# Patient Record
Sex: Female | Born: 1985 | Race: Black or African American | Hispanic: No | Marital: Married | Smoking: Never smoker
Health system: Southern US, Community
[De-identification: ages and names within clinical notes are randomized; demographics above are authoritative.]

## PROBLEM LIST (undated history)

## (undated) ENCOUNTER — Inpatient Hospital Stay (HOSPITAL_COMMUNITY): Payer: Self-pay

## (undated) DIAGNOSIS — IMO0002 Reserved for concepts with insufficient information to code with codable children: Secondary | ICD-10-CM

## (undated) DIAGNOSIS — D649 Anemia, unspecified: Secondary | ICD-10-CM

## (undated) DIAGNOSIS — R87629 Unspecified abnormal cytological findings in specimens from vagina: Secondary | ICD-10-CM

## (undated) DIAGNOSIS — O2302 Infections of kidney in pregnancy, second trimester: Secondary | ICD-10-CM

## (undated) HISTORY — PX: OTHER SURGICAL HISTORY: SHX169

## (undated) HISTORY — PX: COLPOSCOPY: SHX161

---
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
## 2014-10-04 NOTE — L&D Delivery Note (Signed)
Patient is 29 y.o. G1P1001 [redacted]w[redacted]d admitted in [facility]. She had a prolonged/protracted 1st stage of labor with only1 cm of change in 12-13 hours between 9/12 and 9/13. She was put on pitocin 2 times over that period and fetal heart tracing was not-reassuring (Category II tracing) with later decelerations. On 9/13 she was restarted on pitocin and had a Category I strip throughout the day and pitocin was steadily increased. She developed an adequate contraction pattern and progressed to complete at 1450.  The patient pushed from 14:50 until delivery with good maternal effort and fetal descent. With prolonged crowning the infant had deep recurrent variables, nadir in the 70s with good return to baseline between and variability throughout decelerations.Infant's head delivered and had a body cord present which it was delivered through.  Shoulder delivered without problem and infant was initially placed on maternal abdomen and had an initial cry but had non reassuring tone. Cord was cut and infant was transferred to warmer and NICU team arrived.  Infant was noted to have significant moulding and evidence of acynclitic lie as well likely occiput posterior. Dr. Debroah Loop was present in the room for last hour of pushing and at delivery.   Of note mother had bordereline temperature of 37.8 at 1100 but this resolved without intervention as the patient declined tylenol. During pushing there ws fetal tachycardia no maternal tachycardia.   Delivery Note At 5:26 PM a viable female was delivered via Vaginal, Spontaneous Delivery (Presentation: Occiput Anterior).  APGAR: 3, 5; weight 6 lb 7.4 oz (2930 g).   Placenta status: Intact, Spontaneous.  Cord: 3 vessels with the following complications: None.  Cord pH: 7.22  Anesthesia: Epidural  Episiotomy: None Lacerations: 2nd degree;Perineal; Right sulcal Suture Repair: 3.0 Est. Blood Loss (mL): 232  Mom to postpartum.  Baby to Nursery.  Karen Burke [facility] 06/17/2015, 6:35  PM

## 2014-11-13 ENCOUNTER — Ambulatory Visit (INDEPENDENT_AMBULATORY_CARE_PROVIDER_SITE_OTHER): Payer: Self-pay | Admitting: *Deleted

## 2014-11-13 ENCOUNTER — Encounter: Payer: Self-pay | Admitting: *Deleted

## 2014-11-13 DIAGNOSIS — N926 Irregular menstruation, unspecified: Secondary | ICD-10-CM

## 2014-11-13 DIAGNOSIS — R112 Nausea with vomiting, unspecified: Secondary | ICD-10-CM

## 2014-11-13 DIAGNOSIS — Z3201 Encounter for pregnancy test, result positive: Secondary | ICD-10-CM

## 2014-11-13 DIAGNOSIS — O218 Other vomiting complicating pregnancy: Secondary | ICD-10-CM

## 2014-11-13 LAB — POCT PREGNANCY, URINE: Preg Test, Ur: POSITIVE — AB

## 2014-11-13 MED ORDER — PROMETHAZINE HCL 25 MG PO TABS: 25.0000 mg | ORAL_TABLET | Freq: Four times a day (QID) | ORAL | Status: DC | PRN

## 2014-11-13 NOTE — Addendum Note (Signed)
Addended by: Candelaria StagersHAIZLIP, Annaleah Arata E on: 11/13/2014 02:33 PM   Modules accepted: Orders, Level of Service

## 2014-11-13 NOTE — Progress Notes (Addendum)
Pregnancy Test: Positive  Letter of Pregnancy verification given.  LMP 09/05/14 EDC 06/12/15. Reviewed signs/symptoms to come to [facility].  No problems with pregnancy, no ultrasound ordered.  Interpreter present during encounter.  Pt cannot stay today for labs, will draw labs on 1st OB visit.  "FOB request phenergan for nausea, Discussed with Dr. Jolayne Pantheronstant, phenergan prescribed.

## 2014-12-12 ENCOUNTER — Encounter: Payer: Self-pay | Admitting: Family Medicine

## 2014-12-12 ENCOUNTER — Ambulatory Visit (INDEPENDENT_AMBULATORY_CARE_PROVIDER_SITE_OTHER): Payer: Self-pay | Admitting: Family Medicine

## 2014-12-12 VITALS — BP 114/75 | HR 95 | Wt 143.6 lb

## 2014-12-12 DIAGNOSIS — Z118 Encounter for screening for other infectious and parasitic diseases: Secondary | ICD-10-CM

## 2014-12-12 DIAGNOSIS — Z113 Encounter for screening for infections with a predominantly sexual mode of transmission: Secondary | ICD-10-CM

## 2014-12-12 DIAGNOSIS — Z124 Encounter for screening for malignant neoplasm of cervix: Secondary | ICD-10-CM

## 2014-12-12 DIAGNOSIS — Z3492 Encounter for supervision of normal pregnancy, unspecified, second trimester: Secondary | ICD-10-CM | POA: Insufficient documentation

## 2014-12-12 LAB — POCT URINALYSIS DIP (DEVICE)
BILIRUBIN URINE: NEGATIVE
Glucose, UA: NEGATIVE mg/dL
Hgb urine dipstick: NEGATIVE
KETONES UR: NEGATIVE mg/dL
Leukocytes, UA: NEGATIVE
Nitrite: NEGATIVE
PROTEIN: NEGATIVE mg/dL
SPECIFIC GRAVITY, URINE: 1.01 (ref 1.005–1.030)
UROBILINOGEN UA: 0.2 mg/dL (ref 0.0–1.0)
pH: 5.5 (ref 5.0–8.0)

## 2014-12-12 MED ORDER — DOXYLAMINE-PYRIDOXINE 10-10 MG PO TBEC: 1.0000 | DELAYED_RELEASE_TABLET | Freq: Two times a day (BID) | ORAL | Status: DC

## 2014-12-12 MED ORDER — PRENATAL VITAMINS 0.8 MG PO TABS: 1.0000 | ORAL_TABLET | Freq: Every day | ORAL | Status: DC

## 2014-12-12 MED ORDER — RANITIDINE HCL 150 MG PO TABS: 150.0000 mg | ORAL_TABLET | Freq: Two times a day (BID) | ORAL | Status: DC

## 2014-12-12 NOTE — Progress Notes (Signed)
Patient speaks [place] Creole. Interpreter number [phone].

## 2014-12-12 NOTE — Progress Notes (Signed)
   Subjective:    Karen Burke is a G1P0 5629w0d being seen today for her first obstetrical visit.  Her obstetrical history is significant for none. Patient does intend to breast feed. Pregnancy history fully reviewed.  Patient reports heartburn, nausea, no bleeding, no contractions, no cramping and no leaking.  Has some midepigastric pain with nausea  Filed Vitals:   12/12/14 1357  BP: 114/75  Pulse: 95  Weight: 143 lb 9.6 oz (65.137 kg)    HISTORY: OB History  Gravida Para Term Preterm AB SAB TAB Ectopic Multiple Living  1             # Outcome Date GA Lbr Len/2nd Weight Sex Delivery Anes PTL Lv  1 Current              Past Medical History  Diagnosis Date  . Medical history non-contributory    Past Surgical History  Procedure Laterality Date  . Cyst removal arm     Family History  Problem Relation Age of Onset  . Hypertension Mother   . Hypertension Father      Exam    Uterus:     Pelvic Exam:    Perineum: No Hemorrhoids   Vulva: normal   Vagina:  normal mucosa, normal discharge    Declined bimanual exam  System: Breast:  Inspection negative   Skin: normal coloration and turgor, no rashes    Neurologic: oriented, normal   Extremities: normal strength, tone, and muscle mass   HEENT PERRLA and extra ocular movement intact   Mouth/Teeth mucous membranes moist, pharynx normal without lesions   Neck supple and no masses   Cardiovascular: Normal rate    Respiratory:  appears well, vitals normal, no respiratory distress, acyanotic, normal RR, ear and throat exam is normal, neck free of mass or lymphadenopathy, chest clear, no wheezing, crepitations, rhonchi, normal symmetric air entry   Abdomen: soft, non-tender; bowel sounds normal; no masses,  no organomegaly   Urinary: urethral meatus normal      Assessment:    Pregnancy: G1P0 There are no active problems to display for this patient.       Plan:     Initial labs drawn. Prenatal  vitamins. Problem list reviewed and updated. Genetic Screening discussed Quad Screen: to be ordered at next visit.  Ultrasound discussed; fetal survey: requested.  Follow up in 4 weeks. 50% of 20 min visit spent on counseling and coordination of care.  Nausea and midepigastric pain: rx diclegis and zantac, if no improvement may need H pylori testing  Karen Burke ROCIO 12/12/2014

## 2014-12-12 NOTE — Patient Instructions (Signed)
Heartburn During Pregnancy ° Heartburn happens when stomach acid goes up into the esophagus. The esophagus is the tube between the mouth and the stomach. This acid causes a burning pain in the chest or throat. This happens more often in the later part of pregnancy because the womb (uterus) gets larger. It may also happen because of hormone changes. Heartburn problems often go away after giving birth. °HOME CARE °· Take all medicine as told by your doctor. °· Raise the head of your bed with blocks only as told by your doctor. °· Do not exercise right after eating. °· Avoid eating 2-3 hours before bed. Do not lie down right after eating. °· Eat small meals throughout the day instead of 3 large meals. °· Avoid foods that give you heartburn. Foods you may want to avoid include: °¨ Peppers. °¨ Chocolate. °¨ High-fat foods, including fried foods. °¨ Spicy foods. °¨ Garlic and onions. °¨ Citrus fruits, including oranges, grapefruit, lemons, and limes. °¨ Food containing tomatoes or tomato products. °¨ Mint. °¨ Bubbly (carbonated) drinks and drinks with caffeine. °¨ Vinegar. °GET HELP IF: °· You have any belly (abdominal) pain. °· You feel burning in your upper belly or chest, especially after eating or lying down. °· You feel sick to your stomach (nauseous) and throw up (vomit). °· Your stomach feels upset after you eat. °GET HELP RIGHT AWAY IF: °· You have bad chest pain that goes down your arm or into your jaw or neck. °· You feel sweaty, dizzy, or light-headed. °· You have trouble breathing. °· You throw up blood. °· You have trouble or pain when swallowing. °· You have bloody or black poop (stool). °· You have heartburn more than 3 times a week, for more than 2 weeks. °MAKE SURE YOU: °· Understand these instructions. °· Will watch your condition. °· Will get help right away if you are not doing well or get worse. °Document Released: 10/23/2010 Document Revised: 09/25/2013 Document Reviewed: 05/09/2013 °ExitCare®  Patient Information ©2015 [employer]. This information is not intended to replace advice given to you by your health care provider. Make sure you discuss any questions you have with your health care provider. ° °

## 2014-12-13 LAB — PRENATAL PROFILE (SOLSTAS)
Antibody Screen: NEGATIVE
BASOS ABS: 0 10*3/uL (ref 0.0–0.1)
Basophils Relative: 0 % (ref 0–1)
EOS ABS: 0.1 10*3/uL (ref 0.0–0.7)
Eosinophils Relative: 1 % (ref 0–5)
HEMATOCRIT: 32.6 % — AB (ref 36.0–46.0)
HIV 1&2 Ab, 4th Generation: NONREACTIVE
Hemoglobin: 10.4 g/dL — ABNORMAL LOW (ref 12.0–15.0)
Hepatitis B Surface Ag: NEGATIVE
LYMPHS ABS: 1.6 10*3/uL (ref 0.7–4.0)
LYMPHS PCT: 30 % (ref 12–46)
MCH: 26.1 pg (ref 26.0–34.0)
MCHC: 31.9 g/dL (ref 30.0–36.0)
MCV: 81.9 fL (ref 78.0–100.0)
MONO ABS: 0.7 10*3/uL (ref 0.1–1.0)
MONOS PCT: 13 % — AB (ref 3–12)
MPV: 11.4 fL (ref 8.6–12.4)
Neutro Abs: 3 10*3/uL (ref 1.7–7.7)
Neutrophils Relative %: 56 % (ref 43–77)
Platelets: 257 10*3/uL (ref 150–400)
RBC: 3.98 MIL/uL (ref 3.87–5.11)
RDW: 16 % — AB (ref 11.5–15.5)
RH TYPE: POSITIVE
RUBELLA: 9.93 {index} — AB (ref <0.90)
WBC: 5.4 10*3/uL (ref 4.0–10.5)

## 2014-12-13 LAB — CYTOLOGY - PAP

## 2014-12-13 LAB — CULTURE, OB URINE
COLONY COUNT: NO GROWTH
ORGANISM ID, BACTERIA: NO GROWTH

## 2014-12-16 LAB — HEMOGLOBINOPATHY EVALUATION
HGB A: 96.8 % (ref 96.8–97.8)
HGB S QUANTITAION: 0 %
Hemoglobin Other: 0 %
Hgb A2 Quant: 2.9 % (ref 2.2–3.2)
Hgb F Quant: 0.3 % (ref 0.0–2.0)

## 2014-12-17 ENCOUNTER — Encounter: Payer: Self-pay | Admitting: Family Medicine

## 2014-12-17 DIAGNOSIS — Z789 Other specified health status: Secondary | ICD-10-CM | POA: Insufficient documentation

## 2014-12-17 DIAGNOSIS — Z603 Acculturation difficulty: Secondary | ICD-10-CM | POA: Insufficient documentation

## 2014-12-17 LAB — PRESCRIPTION MONITORING PROFILE (19 PANEL)
AMPHETAMINE/METH: NEGATIVE ng/mL
BARBITURATE SCREEN, URINE: NEGATIVE ng/mL
Benzodiazepine Screen, Urine: NEGATIVE ng/mL
Buprenorphine, Urine: NEGATIVE ng/mL
CANNABINOID SCRN UR: NEGATIVE ng/mL
CARISOPRODOL, URINE: NEGATIVE ng/mL
COCAINE METABOLITES: NEGATIVE ng/mL
Creatinine, Urine: 55.63 mg/dL (ref >20.0)
FENTANYL URINE: NEGATIVE ng/mL
MDMA URINE: NEGATIVE ng/mL
METHADONE SCREEN, URINE: NEGATIVE ng/mL
Meperidine, Ur: NEGATIVE ng/mL
Methaqualone: NEGATIVE ng/mL
NITRITES URINE, INITIAL: NEGATIVE ug/mL
Opiate Screen, Urine: NEGATIVE ng/mL
Oxycodone Screen, Ur: NEGATIVE ng/mL
PHENCYCLIDINE, UR: NEGATIVE ng/mL
Propoxyphene: NEGATIVE ng/mL
TAPENTADOLUR: NEGATIVE ng/mL
Tramadol Scrn, Ur: NEGATIVE ng/mL
Zolpidem, Urine: NEGATIVE ng/mL
pH, Initial: 6.8 pH (ref 4.5–8.9)

## 2015-01-09 ENCOUNTER — Ambulatory Visit (INDEPENDENT_AMBULATORY_CARE_PROVIDER_SITE_OTHER): Payer: Self-pay | Admitting: Physician Assistant

## 2015-01-09 ENCOUNTER — Encounter: Payer: Self-pay | Admitting: Physician Assistant

## 2015-01-09 VITALS — BP 132/74 | HR 100 | Temp 97.9°F | Wt 146.8 lb

## 2015-01-09 DIAGNOSIS — B3731 Acute candidiasis of vulva and vagina: Secondary | ICD-10-CM

## 2015-01-09 DIAGNOSIS — Z3492 Encounter for supervision of normal pregnancy, unspecified, second trimester: Secondary | ICD-10-CM

## 2015-01-09 DIAGNOSIS — O98812 Other maternal infectious and parasitic diseases complicating pregnancy, second trimester: Secondary | ICD-10-CM

## 2015-01-09 DIAGNOSIS — Z3482 Encounter for supervision of other normal pregnancy, second trimester: Secondary | ICD-10-CM

## 2015-01-09 DIAGNOSIS — B373 Candidiasis of vulva and vagina: Secondary | ICD-10-CM

## 2015-01-09 LAB — POCT URINALYSIS DIP (DEVICE)
Bilirubin Urine: NEGATIVE
Glucose, UA: NEGATIVE mg/dL
Hgb urine dipstick: NEGATIVE
KETONES UR: NEGATIVE mg/dL
LEUKOCYTES UA: NEGATIVE
NITRITE: NEGATIVE
Protein, ur: NEGATIVE mg/dL
SPECIFIC GRAVITY, URINE: 1.01 (ref 1.005–1.030)
Urobilinogen, UA: 0.2 mg/dL (ref 0.0–1.0)
pH: 6.5 (ref 5.0–8.0)

## 2015-01-09 MED ORDER — TERCONAZOLE 0.4 % VA CREA: 1.0000 | TOPICAL_CREAM | Freq: Every day | VAGINAL | Status: DC

## 2015-01-09 NOTE — Patient Instructions (Signed)
AFP Maternal This is a routine screen (tests) used to check for fetal abnormalities such as Down syndrome and neural tube defects. Down syndrome is a chromosomal abnormality, sometimes called Trisomy 21. Neural tube defects are serious birth defects. The brain, spinal cord, or their coverings do not develop completely. Women should be tested in the 15th to 20th week of pregnancy. The msAFP screen involves three or four tests that measure substances found in the blood that make the testing better. During development, AFP levels in fetal blood and amniotic fluid rise until about 12 weeks. The levels then gradually fall until birth. AFP is a protein produce by fetal tissue. AFP crosses the placenta and appears in the maternal blood. A baby with an open neural tube defect has an opening in its spine, head, or abdominal wall that allows higher than usual amounts of AFP to pass into the mother's blood. If a screen is positive, more tests are needed to make a diagnosis. These include ultrasound and perhaps amniocentesis (checking the fluid that surrounds the baby). These tests are used to help women and their caregivers make decisions about the management of their pregnancies. In pregnancies where the fetus is carrying the chromosomal defect that results in Down syndrome, the levels of AFP and unconjugated estriol tend to be low and hCG and inhibin A levels high.  PREPARATION FOR TEST Blood is drawn from a vein in your arm usually between the 15th and 20th weeks of pregnancy. Four different tests on your blood are done. These are AFP, hCG, unconjugated estriol, and inhibin A. The combination of tests produces a more accurate result. NORMAL FINDINGS   Adult: less than 40 ng/mL or less than 40 mg/L (SI units).  Child younger than 1 year: less than 30 ng/mL. Ranges are stratified by weeks of gestation and vary among laboratories. Ranges for normal findings may vary among different laboratories and hospitals. You  should always check with your doctor after having lab work or other tests done to discuss the meaning of your test results and whether your values are considered within normal limits. MEANING OF TEST  These are screening tests. Not all fetal abnormalities will give positive test results. Of all women who have positive AFP screening results, only a very small number of them have babies who actually have a neural tube defect or chromosomal abnormality. Your caregiver will go over the test results with you and discuss the importance and meaning of your results, as well as treatment options and the need for additional tests if necessary. OBTAINING THE TEST RESULTS It is your responsibility to obtain your test results. Ask the lab or department performing the test when and how you will get your results. Document Released: 10/12/2004 Document Revised: 02/04/2014 Document Reviewed: 08/24/2008 ExitCare Patient Information 2015 [employer]. This information is not intended to replace advice given to you by your health care provider. Make sure you discuss any questions you have with your health care provider.  

## 2015-01-09 NOTE — Progress Notes (Signed)
Karen Burke Interpreter for encounter Pt complains of itchy discharge

## 2015-01-09 NOTE — Progress Notes (Signed)
18 weeks,  stable.  concerned about itching at external genitalia.  Rx for terazole Wet prep pending Keep u/s appt 4.15 RTC for OBF 4 weeks

## 2015-01-10 LAB — WET PREP, GENITAL
CLUE CELLS WET PREP: NONE SEEN
Trich, Wet Prep: NONE SEEN
WBC, Wet Prep HPF POC: NONE SEEN
Yeast Wet Prep HPF POC: NONE SEEN

## 2015-01-16 ENCOUNTER — Ambulatory Visit (HOSPITAL_COMMUNITY): Payer: Self-pay

## 2015-01-17 ENCOUNTER — Ambulatory Visit (HOSPITAL_COMMUNITY)
Admission: RE | Admit: 2015-01-17 | Discharge: 2015-01-17 | Disposition: A | Discharge status: Home / Self Care | Payer: Self-pay | Source: Ambulatory Visit | Attending: Family Medicine | Admitting: Family Medicine

## 2015-01-17 DIAGNOSIS — Z3A19 19 weeks gestation of pregnancy: Secondary | ICD-10-CM | POA: Insufficient documentation

## 2015-01-17 DIAGNOSIS — Z3A20 20 weeks gestation of pregnancy: Secondary | ICD-10-CM | POA: Insufficient documentation

## 2015-01-17 DIAGNOSIS — Z3689 Encounter for other specified antenatal screening: Secondary | ICD-10-CM | POA: Insufficient documentation

## 2015-01-17 DIAGNOSIS — Z3492 Encounter for supervision of normal pregnancy, unspecified, second trimester: Secondary | ICD-10-CM

## 2015-01-17 DIAGNOSIS — Z36 Encounter for antenatal screening of mother: Secondary | ICD-10-CM | POA: Insufficient documentation

## 2015-02-11 ENCOUNTER — Encounter: Payer: Self-pay | Admitting: Family

## 2015-02-11 ENCOUNTER — Ambulatory Visit (INDEPENDENT_AMBULATORY_CARE_PROVIDER_SITE_OTHER): Payer: Self-pay | Admitting: Family

## 2015-02-11 VITALS — BP 115/69 | HR 109 | Wt 153.2 lb

## 2015-02-11 DIAGNOSIS — Z3492 Encounter for supervision of normal pregnancy, unspecified, second trimester: Secondary | ICD-10-CM

## 2015-02-11 DIAGNOSIS — Z3482 Encounter for supervision of other normal pregnancy, second trimester: Secondary | ICD-10-CM

## 2015-02-11 LAB — POCT URINALYSIS DIP (DEVICE)
Bilirubin Urine: NEGATIVE
Glucose, UA: NEGATIVE mg/dL
Hgb urine dipstick: NEGATIVE
KETONES UR: NEGATIVE mg/dL
LEUKOCYTES UA: NEGATIVE
Nitrite: NEGATIVE
PH: 7 (ref 5.0–8.0)
PROTEIN: NEGATIVE mg/dL
Specific Gravity, Urine: 1.02 (ref 1.005–1.030)
Urobilinogen, UA: 0.2 mg/dL (ref 0.0–1.0)

## 2015-02-11 NOTE — Progress Notes (Signed)
Interpreter:  Areatha KeasMarie Ferron   Reviewed ultrasound results (bilat renal pyelectasis and poor view of heart); rescan in one week.  Pt husband brought in bills with concern for needing to pay and never receiving the result.  Explained that results are typically reviewed if abnormal.  Reviewed all results with patient and husband.  Husband states a genetic test was drawn too early and they declined testing.  Requested he bring in lab bill and will talk with Tresa EndoKelly R. About getting it removed/waived.

## 2015-02-19 ENCOUNTER — Ambulatory Visit (HOSPITAL_COMMUNITY): Payer: Self-pay

## 2015-03-11 ENCOUNTER — Encounter: Payer: Self-pay | Admitting: Family

## 2015-04-11 ENCOUNTER — Ambulatory Visit (HOSPITAL_COMMUNITY): Admission: RE | Admit: 2015-04-11 | Payer: Self-pay | Source: Ambulatory Visit

## 2015-04-11 ENCOUNTER — Ambulatory Visit (HOSPITAL_COMMUNITY)
Admission: RE | Admit: 2015-04-11 | Discharge: 2015-04-11 | Disposition: A | Discharge status: Home / Self Care | Payer: BLUE CROSS/BLUE SHIELD | Source: Ambulatory Visit | Attending: Family | Admitting: Family

## 2015-04-11 DIAGNOSIS — O35EXX0 Maternal care for other (suspected) fetal abnormality and damage, fetal genitourinary anomalies, not applicable or unspecified: Secondary | ICD-10-CM | POA: Insufficient documentation

## 2015-04-11 DIAGNOSIS — O358XX0 Maternal care for other (suspected) fetal abnormality and damage, not applicable or unspecified: Secondary | ICD-10-CM | POA: Insufficient documentation

## 2015-04-11 DIAGNOSIS — Z36 Encounter for antenatal screening of mother: Secondary | ICD-10-CM | POA: Diagnosis not present

## 2015-04-11 DIAGNOSIS — Z3A31 31 weeks gestation of pregnancy: Secondary | ICD-10-CM | POA: Insufficient documentation

## 2015-04-11 DIAGNOSIS — O283 Abnormal ultrasonic finding on antenatal screening of mother: Secondary | ICD-10-CM | POA: Diagnosis not present

## 2015-04-11 DIAGNOSIS — IMO0002 Reserved for concepts with insufficient information to code with codable children: Secondary | ICD-10-CM | POA: Insufficient documentation

## 2015-04-11 DIAGNOSIS — Z0489 Encounter for examination and observation for other specified reasons: Secondary | ICD-10-CM | POA: Insufficient documentation

## 2015-04-14 DIAGNOSIS — IMO0002 Reserved for concepts with insufficient information to code with codable children: Secondary | ICD-10-CM | POA: Insufficient documentation

## 2015-04-16 ENCOUNTER — Ambulatory Visit (INDEPENDENT_AMBULATORY_CARE_PROVIDER_SITE_OTHER): Payer: BLUE CROSS/BLUE SHIELD | Admitting: Advanced Practice Midwife

## 2015-04-16 VITALS — BP 108/73 | HR 102 | Temp 98.3°F | Wt 160.0 lb

## 2015-04-16 DIAGNOSIS — Z3482 Encounter for supervision of other normal pregnancy, second trimester: Secondary | ICD-10-CM

## 2015-04-16 DIAGNOSIS — Z3492 Encounter for supervision of normal pregnancy, unspecified, second trimester: Secondary | ICD-10-CM

## 2015-04-16 LAB — POCT URINALYSIS DIP (DEVICE)
Bilirubin Urine: NEGATIVE
Glucose, UA: NEGATIVE mg/dL
HGB URINE DIPSTICK: NEGATIVE
Ketones, ur: NEGATIVE mg/dL
Nitrite: NEGATIVE
Protein, ur: NEGATIVE mg/dL
Specific Gravity, Urine: 1.015 (ref 1.005–1.030)
Urobilinogen, UA: 0.2 mg/dL (ref 0.0–1.0)
pH: 7 (ref 5.0–8.0)

## 2015-04-16 NOTE — Progress Notes (Signed)
[employer] interpreter [phone] used for visit today Reviewed tip of week with patient

## 2015-04-23 NOTE — Progress Notes (Signed)
No complaints. [employer] interpreter used for visit, but pt prefers to use her husband for interpreting. He is a paramedic and understands medical terminology. . Discussed fetal pyelectasis. F/U w/ peds after birth.

## 2015-04-24 ENCOUNTER — Encounter: Payer: Self-pay | Admitting: *Deleted

## 2015-04-30 ENCOUNTER — Ambulatory Visit (INDEPENDENT_AMBULATORY_CARE_PROVIDER_SITE_OTHER): Payer: BLUE CROSS/BLUE SHIELD | Admitting: Advanced Practice Midwife

## 2015-04-30 VITALS — BP 112/74 | HR 97 | Temp 98.7°F | Wt 163.1 lb

## 2015-04-30 DIAGNOSIS — Z23 Encounter for immunization: Secondary | ICD-10-CM | POA: Diagnosis not present

## 2015-04-30 DIAGNOSIS — Z3483 Encounter for supervision of other normal pregnancy, third trimester: Secondary | ICD-10-CM | POA: Diagnosis not present

## 2015-04-30 LAB — POCT URINALYSIS DIP (DEVICE)
Bilirubin Urine: NEGATIVE
Glucose, UA: NEGATIVE mg/dL
HGB URINE DIPSTICK: NEGATIVE
Ketones, ur: NEGATIVE mg/dL
Leukocytes, UA: NEGATIVE
Nitrite: NEGATIVE
Protein, ur: NEGATIVE mg/dL
SPECIFIC GRAVITY, URINE: 1.015 (ref 1.005–1.030)
Urobilinogen, UA: 0.2 mg/dL (ref 0.0–1.0)
pH: 6.5 (ref 5.0–8.0)

## 2015-04-30 LAB — CBC
HCT: 28.8 % — ABNORMAL LOW (ref 36.0–46.0)
Hemoglobin: 9.3 g/dL — ABNORMAL LOW (ref 12.0–15.0)
MCH: 26.8 pg (ref 26.0–34.0)
MCHC: 32.3 g/dL (ref 30.0–36.0)
MCV: 83 fL (ref 78.0–100.0)
MPV: 11.3 fL (ref 8.6–12.4)
Platelets: 244 10*3/uL (ref 150–400)
RBC: 3.47 MIL/uL — ABNORMAL LOW (ref 3.87–5.11)
RDW: 15.8 % — AB (ref 11.5–15.5)
WBC: 6.3 10*3/uL (ref 4.0–10.5)

## 2015-04-30 MED ORDER — TETANUS-DIPHTH-ACELL PERTUSSIS 5-2.5-18.5 LF-MCG/0.5 IM SUSP
0.5000 mL | Freq: Once | INTRAMUSCULAR | Status: AC
  Administered 2015-04-30: 0.5 mL via INTRAMUSCULAR

## 2015-04-30 NOTE — Progress Notes (Signed)
Used Interpreter  Karen Burke. Declines rpr and  hiv test.

## 2015-04-30 NOTE — Progress Notes (Signed)
Used interpreter Areatha Keas for all communication.    Subjective:  Karen Burke is a 29 y.o. G1P0 at [redacted]w[redacted]d being seen today for ongoing prenatal care.  Patient reports no complaints and some occasional sharp abdominal pains with movement.  Contractions: Irregular.  Vag. Bleeding: None. Movement: Present. Denies leaking of fluid.   The following portions of the patient's history were reviewed and updated as appropriate: allergies, current medications, past family history, past medical history, past social history, past surgical history and problem list.   Objective:   Filed Vitals:   04/30/15 1038  BP: 112/74  Pulse: 97  Temp: 98.7 F (37.1 C)  Weight: 163 lb 1.6 oz (73.982 kg)    Fetal Status: Fetal Heart Rate (bpm): 148 Fundal Height: 34 cm Movement: Present     General:  Alert, oriented and cooperative. Patient is in no acute distress.  Skin: Skin is warm and dry. No rash noted.   Cardiovascular: Normal heart rate noted  Respiratory: Normal respiratory effort, no problems with respiration noted  Abdomen: Soft, gravid, appropriate for gestational age. Pain/Pressure: Present     Vaginal: Vag. Bleeding: None.       Cervix: Not evaluated        Extremities: Normal range of motion.  Edema: Mild pitting, slight indentation  Mental Status: Normal mood and affect. Normal behavior. Normal judgment and thought content.   Urinalysis: Urine Protein: Negative Urine Glucose: Negative  Assessment and Plan:  Pregnancy: G1P0 at [redacted]w[redacted]d  1. Supervision of normal pregnancy, antepartum, third trimester  - Glucose Tolerance, 1 HR (50g) w/o Fasting - CBC - Tdap (BOOSTRIX) injection 0.5 mL; Inject 0.5 mLs into the muscle once.  Preterm labor symptoms and general obstetric precautions including but not limited to vaginal bleeding, contractions, leaking of fluid and fetal movement were reviewed in detail with the patient. Reviewed recent U/S results and pyelectasis with pt. Questions  answered.  Please refer to After Visit Summary for other counseling recommendations.  Return in about 2 weeks (around 05/14/2015).   Hurshel Party, CNM

## 2015-05-01 LAB — GLUCOSE TOLERANCE, 1 HOUR (50G) W/O FASTING: GLUCOSE 1 HOUR GTT: 90 mg/dL (ref 70–140)

## 2015-05-04 ENCOUNTER — Encounter: Payer: Self-pay | Admitting: Advanced Practice Midwife

## 2015-05-04 ENCOUNTER — Other Ambulatory Visit: Payer: Self-pay | Admitting: Advanced Practice Midwife

## 2015-05-04 DIAGNOSIS — O99013 Anemia complicating pregnancy, third trimester: Secondary | ICD-10-CM | POA: Insufficient documentation

## 2015-05-04 MED ORDER — PRENATAL VITAMINS 28-0.8 MG PO TABS: 1.0000 | ORAL_TABLET | Freq: Every day | ORAL | Status: DC

## 2015-05-04 MED ORDER — FERROUS SULFATE 325 (65 FE) MG PO TABS: 325.0000 mg | ORAL_TABLET | Freq: Two times a day (BID) | ORAL | Status: DC

## 2015-05-05 ENCOUNTER — Telehealth: Payer: Self-pay | Admitting: *Deleted

## 2015-05-05 NOTE — Telephone Encounter (Signed)
-----   Message from Hurshel Party, CNM sent at 05/04/2015 12:46 AM EDT ----- Pt recent labs indicate she is anemic. She should be taking PNV daily and ferrous sulfate 1-2 times per day.  I sent Rx to her pharmacy. These may cause constipation.  She should drink plenty of water and eat a high fiber diet.  She may take Colace, stool softener, as needed.  Please call to let her know. Thank you.

## 2015-05-05 NOTE — Telephone Encounter (Signed)
Contacted patient, recommendations to relieve constipation given. Pt verbalizes understanding.  Spouse used as Equities trader.

## 2015-05-20 ENCOUNTER — Ambulatory Visit (INDEPENDENT_AMBULATORY_CARE_PROVIDER_SITE_OTHER): Payer: BLUE CROSS/BLUE SHIELD | Admitting: Obstetrics and Gynecology

## 2015-05-20 ENCOUNTER — Other Ambulatory Visit: Payer: Self-pay | Admitting: Obstetrics and Gynecology

## 2015-05-20 VITALS — BP 120/77 | HR 93 | Temp 97.9°F | Wt 167.8 lb

## 2015-05-20 DIAGNOSIS — O99820 Streptococcus B carrier state complicating pregnancy: Secondary | ICD-10-CM

## 2015-05-20 DIAGNOSIS — O99013 Anemia complicating pregnancy, third trimester: Secondary | ICD-10-CM

## 2015-05-20 LAB — POCT URINALYSIS DIP (DEVICE)
BILIRUBIN URINE: NEGATIVE
GLUCOSE, UA: NEGATIVE mg/dL
Hgb urine dipstick: NEGATIVE
Ketones, ur: NEGATIVE mg/dL
Leukocytes, UA: NEGATIVE
Nitrite: NEGATIVE
Protein, ur: NEGATIVE mg/dL
SPECIFIC GRAVITY, URINE: 1.015 (ref 1.005–1.030)
Urobilinogen, UA: 0.2 mg/dL (ref 0.0–1.0)
pH: 7 (ref 5.0–8.0)

## 2015-05-20 LAB — OB RESULTS CONSOLE GC/CHLAMYDIA
CHLAMYDIA, DNA PROBE: NEGATIVE
GC PROBE AMP, GENITAL: NEGATIVE

## 2015-05-20 LAB — OB RESULTS CONSOLE GBS: STREP GROUP B AG: POSITIVE

## 2015-05-20 NOTE — Patient Instructions (Addendum)
Iron-Rich Diet An iron-rich diet contains foods that are good sources of iron. Iron is an important mineral that helps your body produce hemoglobin. Hemoglobin is a protein in red blood cells that carries oxygen to the body's tissues. Sometimes, the iron level in your blood can be low. This may be caused by:  A lack of iron in your diet.  Blood loss.  Times of growth, such as during pregnancy or during a child's growth and development. Low levels of iron can cause a decrease in the number of red blood cells. This can result in iron deficiency anemia. Iron deficiency anemia symptoms include:  Tiredness.  Weakness.  Irritability.  Increased chance of infection. Here are some recommendations for daily iron intake:  Males older than 27 years of age need 8 mg of iron per day.  Women ages 32 to 63 need 18 mg of iron per day.  Pregnant women need 27 mg of iron per day, and women who are over 24 years of age and breastfeeding need 9 mg of iron per day.  Women over the age of 60 need 8 mg of iron per day. SOURCES OF IRON There are 2 types of iron that are found in food: heme iron and nonheme iron. Heme iron is absorbed by the body better than nonheme iron. Heme iron is found in meat, poultry, and fish. Nonheme iron is found in grains, beans, and vegetables. Heme Iron Sources Food / Iron (mg)  Chicken liver, 3 oz (85 g)/ 10 mg  Beef liver, 3 oz (85 g)/ 5.5 mg  Oysters, 3 oz (85 g)/ 8 mg  Beef, 3 oz (85 g)/ 2 to 3 mg  Shrimp, 3 oz (85 g)/ 2.8 mg  [place], 3 oz (85 g)/ 2 mg  Chicken, 3 oz (85 g) / 1 mg  Fish (tuna, halibut), 3 oz (85 g)/ 1 mg  Pork, 3 oz (85 g)/ 0.9 mg Nonheme Iron Sources Food / Iron (mg)  Ready-to-eat breakfast cereal, iron-fortified / 3.9 to 7 mg  Tofu,  cup / 3.4 mg  Kidney beans,  cup / 2.6 mg  Baked potato with skin / 2.7 mg  Asparagus,  cup / 2.2 mg  Avocado / 2 mg  Dried peaches,  cup / 1.6 mg  Raisins,  cup / 1.5 mg  Soy milk, 1 cup  / 1.5 mg  Whole-wheat bread, 1 slice / 1.2 mg  Spinach, 1 cup / 0.8 mg  Broccoli,  cup / 0.6 mg IRON ABSORPTION Certain foods can decrease the body's absorption of iron. Try to avoid these foods and beverages while eating meals with iron-containing foods:  Coffee.  Tea.  Fiber.  Soy. Foods containing vitamin C can help increase the amount of iron your body absorbs from iron sources, especially from nonheme sources. Eat foods with vitamin C along with iron-containing foods to increase your iron absorption. Foods that are high in vitamin C include many fruits and vegetables. Some good sources are:  Fresh orange juice.  Oranges.  Strawberries.  Mangoes.  Grapefruit.  Red bell peppers.  Green bell peppers.  Broccoli.  Potatoes with skin.  Tomato juice. Document Released: 05/04/2005 Document Revised: 12/13/2011 Document Reviewed: 03/11/2011 [facility] Patient Information 2015 [place], [place]. This information is not intended to replace advice given to you by your health care provider. Make sure you discuss any questions you have with your health care provider. Round Ligament Pain During Pregnancy   Round ligament pain is a sharp pain or jabbing  feeling often felt in the lower belly or groin area on one or both sides. It is one of the most common complaints during pregnancy and is considered a normal part of pregnancy. It is most often felt during the second trimester.   Here is what you need to know about round ligament pain, including some tips to help you feel better.   Causes of Round Ligament Pain   Several thick ligaments surround and support your womb (uterus) as it grows during pregnancy. One of them is called the round ligament.   The round ligament connects the front part of the womb to your groin, the area where your legs attach to your pelvis. The round ligament normally tightens and relaxes slowly.   As your baby and womb grow, the round ligament stretches.  That makes it more likely to become strained.   Sudden movements can cause the ligament to tighten quickly, like a rubber band snapping. This causes a sudden and quick jabbing feeling.   Symptoms of Round Ligament Pain   Round ligament pain can be concerning and uncomfortable. But it is considered normal as your body changes during pregnancy.   The symptoms of round ligament pain include a sharp, sudden spasm in the belly. It usually affects the right side, but it may happen on both sides. The pain only lasts a few seconds.   Exercise may cause the pain, as will rapid movements such as:  sneezing  coughing  laughing  rolling over in bed  standing up too quickly   Treatment of Round Ligament Pain   Here are some tips that may help reduce your discomfort:   Pain relief. Take over-the-counter acetaminophen for pain, if necessary. Ask your doctor if this is OK.   Exercise. Get plenty of exercise to keep your stomach (core) muscles strong. Doing stretching exercises or prenatal yoga can be helpful. Ask your doctor which exercises are safe for you and your baby.   A helpful exercise involves putting your hands and knees on the floor, lowering your head, and pushing your backside into the air.   Avoid sudden movements. Change positions slowly (such as standing up or sitting down) to avoid sudden movements that may cause stretching and pain.   Flex your hips. Bend and flex your hips before you cough, sneeze, or laugh to avoid pulling on the ligaments.   Apply warmth. A heating pad or warm bath may be helpful. Ask your doctor if this is OK. Extreme heat can be dangerous to the baby.   You should try to modify your daily activity level and avoid positions that may worsen the condition.   When to Call the Doctor/Midwife   Always tell your doctor or midwife about any type of pain you have during pregnancy. Round ligament pain is quick and doesn't last long.   Call your health care provider  immediately if you have:  severe pain  fever  chills  pain on urination  difficulty walking   Belly pain during pregnancy can be due to many different causes. It is important for your doctor to rule out more serious conditions, including pregnancy complications such as placenta abruption or non-pregnancy illnesses such as:  inguinal hernia  appendicitis  stomach, liver, and kidney problems  Preterm labor pains may sometimes be mistaken for round ligament pain.    l

## 2015-05-20 NOTE — Progress Notes (Signed)
[name] 36 wk labs today

## 2015-05-20 NOTE — Progress Notes (Signed)
Subjective:  Karen Burke is a 29 y.o. G1P0 at [redacted]w[redacted]d being seen today for ongoing prenatal care.  Patient reports RLP sx. Has started to eat red meat and is on iron supplement.  Contractions: Not present.  Vag. Bleeding: None. Movement: Present. Denies leaking of fluid. Speaks [place]. Husband fluent in [place], works as Museum/gallery exhibitions officer. Interpreter used.   The following portions of the patient's history were reviewed and updated as appropriate: allergies, current medications, past family history, past medical history, past social history, past surgical history and problem list.   Objective:  Hgb 9.3, MCV 28.8 Filed Vitals:   05/20/15 1117  BP: 120/77  Pulse: 93  Temp: 97.9 F (36.6 C)  Weight: 167 lb 12.8 oz (76.114 kg)    Fetal Status: Fetal Heart Rate (bpm): 147   Movement: Present     General:  Alert, oriented and cooperative. Patient is in no acute distress.  Skin: Skin is warm and dry. No rash noted.   Cardiovascular: Normal heart rate noted  Respiratory: Normal respiratory effort, no problems with respiration noted  Abdomen: Soft, gravid, appropriate for gestational age. Pain/Pressure: Present     Pelvic: Vag. Bleeding: None     Cervical exam performed        Extremities: Normal range of motion.  Edema: Trace  Mental Status: Normal mood and affect. Normal behavior. Normal judgment and thought content.   Urinalysis: Urine Protein: Negative Urine Glucose: Negative  Assessment and Plan:  Pregnancy: G1P0 at [redacted]w[redacted]d  1. Anemia affecting pregnancy in third trimester, antepartum Iron-rich foods reviewed.  - Culture, beta strep (group b only) - GC/Chlamydia Probe Amp Contraceptive options reviewed.   Term labor symptoms and general obstetric precautions including but not limited to vaginal bleeding, contractions, leaking of fluid and fetal movement were reviewed in detail with the patient. Please refer to After Visit Summary for other counseling recommendations.  Return in about 1 week  (around 05/27/2015).   Danae Orleans, CNM

## 2015-05-21 LAB — GC/CHLAMYDIA PROBE AMP
CT PROBE, AMP APTIMA: NEGATIVE
GC Probe RNA: NEGATIVE

## 2015-05-23 LAB — CULTURE, BETA STREP (GROUP B ONLY)

## 2015-05-27 ENCOUNTER — Encounter: Payer: BLUE CROSS/BLUE SHIELD | Admitting: Advanced Practice Midwife

## 2015-05-28 ENCOUNTER — Encounter: Payer: BLUE CROSS/BLUE SHIELD | Admitting: Family

## 2015-06-11 DIAGNOSIS — O99820 Streptococcus B carrier state complicating pregnancy: Secondary | ICD-10-CM | POA: Insufficient documentation

## 2015-06-13 ENCOUNTER — Ambulatory Visit (INDEPENDENT_AMBULATORY_CARE_PROVIDER_SITE_OTHER): Payer: BLUE CROSS/BLUE SHIELD | Admitting: Obstetrics and Gynecology

## 2015-06-13 ENCOUNTER — Telehealth (HOSPITAL_COMMUNITY): Payer: Self-pay | Admitting: *Deleted

## 2015-06-13 VITALS — BP 132/86 | HR 110 | Temp 97.9°F | Wt 177.9 lb

## 2015-06-13 DIAGNOSIS — O480 Post-term pregnancy: Secondary | ICD-10-CM | POA: Diagnosis not present

## 2015-06-13 LAB — POCT URINALYSIS DIP (DEVICE)
BILIRUBIN URINE: NEGATIVE
GLUCOSE, UA: NEGATIVE mg/dL
KETONES UR: NEGATIVE mg/dL
LEUKOCYTES UA: NEGATIVE
Nitrite: NEGATIVE
Protein, ur: 30 mg/dL — AB
Specific Gravity, Urine: 1.025 (ref 1.005–1.030)
Urobilinogen, UA: 0.2 mg/dL (ref 0.0–1.0)
pH: 6.5 (ref 5.0–8.0)

## 2015-06-13 NOTE — Telephone Encounter (Signed)
Preadmission screen  

## 2015-06-13 NOTE — Progress Notes (Signed)
[employer] Interpreter # [phone] used for encounter today.

## 2015-06-13 NOTE — Progress Notes (Signed)
Subjective:  Karen Burke is a 29 y.o. G1P0 at [redacted]w[redacted]d being seen today for ongoing prenatal care.  Patient reports contractions every 5-10 minutes, very mild. No ha, vision change, sob, or ruq pain.   Vag. Bleeding: None. Movement: Present. Denies leaking of fluid.   The following portions of the patient's history were reviewed and updated as appropriate: allergies, current medications, past family history, past medical history, past social history, past surgical history and problem list.   Objective:   Filed Vitals:   06/13/15 1140  BP: 132/86  Pulse: 110  Temp: 97.9 F (36.6 C)  Weight: 177 lb 14.4 oz (80.695 kg)    Fetal Status: Fetal Heart Rate (bpm): NST   Movement: Present     General:  Alert, oriented and cooperative. Patient is in no acute distress.  Skin: Skin is warm and dry. No rash noted.   Cardiovascular: Normal heart rate noted  Respiratory: Normal respiratory effort, no problems with respiration noted  Abdomen: Soft, gravid, appropriate for gestational age. Pain/Pressure: Present     Pelvic: Vag. Bleeding: None     Cervical exam deferred        Extremities: Normal range of motion.  Edema: Mild pitting, slight indentation  Mental Status: Normal mood and affect. Normal behavior. Normal judgment and thought content.   Urinalysis: Urine Protein: 1+ Urine Glucose: Negative  Assessment and Plan:  Pregnancy: G1P0 at [redacted]w[redacted]d  1. Post term pregnancy, antepartum condition or complication - Fetal nonstress test reactive, f/u next Tuesday for nst and afi, induction scheduled for next Thursday when will be 43 and 0 - declined flu - continue ferrous sulfate and pnv - bp wnl, trace protein, no preec symptoms - gave strict preeclampsia return precautions  Term labor symptoms and general obstetric precautions including but not limited to vaginal bleeding, contractions, leaking of fluid and fetal movement were reviewed in detail with the patient. Please refer to After Visit Summary  for other counseling recommendations.  Return in about 4 days (around 06/17/2015) for NST/AFI as scheduled.   Kathrynn Running, MD

## 2015-06-13 NOTE — Progress Notes (Signed)
Breastfeeding tip of the week reviewed Patient for NST today

## 2015-06-16 ENCOUNTER — Inpatient Hospital Stay (HOSPITAL_COMMUNITY): Payer: BLUE CROSS/BLUE SHIELD | Admitting: Anesthesiology

## 2015-06-16 ENCOUNTER — Inpatient Hospital Stay (HOSPITAL_COMMUNITY)
Admission: AD | Admit: 2015-06-16 | Discharge: 2015-06-19 | DRG: 775 | Disposition: A | Discharge status: Home / Self Care | Payer: BLUE CROSS/BLUE SHIELD | Source: Ambulatory Visit | Attending: Family Medicine | Admitting: Family Medicine

## 2015-06-16 ENCOUNTER — Encounter (HOSPITAL_COMMUNITY): Payer: Self-pay | Admitting: *Deleted

## 2015-06-16 DIAGNOSIS — O358XX0 Maternal care for other (suspected) fetal abnormality and damage, not applicable or unspecified: Secondary | ICD-10-CM | POA: Diagnosis present

## 2015-06-16 DIAGNOSIS — Z3A40 40 weeks gestation of pregnancy: Secondary | ICD-10-CM | POA: Diagnosis present

## 2015-06-16 DIAGNOSIS — IMO0001 Reserved for inherently not codable concepts without codable children: Secondary | ICD-10-CM

## 2015-06-16 DIAGNOSIS — IMO0002 Reserved for concepts with insufficient information to code with codable children: Secondary | ICD-10-CM

## 2015-06-16 DIAGNOSIS — Z880 Allergy status to penicillin: Secondary | ICD-10-CM

## 2015-06-16 DIAGNOSIS — O99820 Streptococcus B carrier state complicating pregnancy: Secondary | ICD-10-CM

## 2015-06-16 DIAGNOSIS — Z789 Other specified health status: Secondary | ICD-10-CM | POA: Diagnosis present

## 2015-06-16 DIAGNOSIS — O99824 Streptococcus B carrier state complicating childbirth: Secondary | ICD-10-CM | POA: Diagnosis present

## 2015-06-16 DIAGNOSIS — Z8249 Family history of ischemic heart disease and other diseases of the circulatory system: Secondary | ICD-10-CM

## 2015-06-16 DIAGNOSIS — O99013 Anemia complicating pregnancy, third trimester: Secondary | ICD-10-CM

## 2015-06-16 HISTORY — DX: Anemia, unspecified: D64.9

## 2015-06-16 LAB — CBC
HCT: 32.5 % — ABNORMAL LOW (ref 36.0–46.0)
Hemoglobin: 10.6 g/dL — ABNORMAL LOW (ref 12.0–15.0)
MCH: 27.4 pg (ref 26.0–34.0)
MCHC: 32.6 g/dL (ref 30.0–36.0)
MCV: 84 fL (ref 78.0–100.0)
Platelets: 184 10*3/uL (ref 150–400)
RBC: 3.87 MIL/uL (ref 3.87–5.11)
RDW: 18.8 % — ABNORMAL HIGH (ref 11.5–15.5)
WBC: 7.9 10*3/uL (ref 4.0–10.5)

## 2015-06-16 LAB — TYPE AND SCREEN
ABO/RH(D): O POS
Antibody Screen: NEGATIVE

## 2015-06-16 LAB — ABO/RH: ABO/RH(D): O POS

## 2015-06-16 MED ORDER — CITRIC ACID-SODIUM CITRATE 334-500 MG/5ML PO SOLN
30.0000 mL | ORAL | Status: DC | PRN
  Filled 2015-06-16: qty 15

## 2015-06-16 MED ORDER — PHENYLEPHRINE 40 MCG/ML (10ML) SYRINGE FOR IV PUSH (FOR BLOOD PRESSURE SUPPORT)
80.0000 ug | PREFILLED_SYRINGE | INTRAVENOUS | Status: DC | PRN
  Filled 2015-06-16: qty 2

## 2015-06-16 MED ORDER — LACTATED RINGERS IV SOLN: INTRAVENOUS | Status: DC

## 2015-06-16 MED ORDER — ACETAMINOPHEN 325 MG PO TABS
650.0000 mg | ORAL_TABLET | ORAL | Status: DC | PRN
  Filled 2015-06-16: qty 2

## 2015-06-16 MED ORDER — PROMETHAZINE HCL 25 MG/ML IJ SOLN
12.5000 mg | INTRAMUSCULAR | Status: DC | PRN
  Administered 2015-06-16: 12.5 mg via INTRAVENOUS
  Filled 2015-06-16: qty 1

## 2015-06-16 MED ORDER — FENTANYL CITRATE (PF) 100 MCG/2ML IJ SOLN
50.0000 ug | INTRAMUSCULAR | Status: DC | PRN
  Administered 2015-06-16 (×6): 100 ug via INTRAVENOUS
  Filled 2015-06-16 (×6): qty 2

## 2015-06-16 MED ORDER — PHENYLEPHRINE 40 MCG/ML (10ML) SYRINGE FOR IV PUSH (FOR BLOOD PRESSURE SUPPORT)
PREFILLED_SYRINGE | INTRAVENOUS | Status: AC
  Filled 2015-06-16: qty 20

## 2015-06-16 MED ORDER — PENICILLIN G POTASSIUM 5000000 UNITS IJ SOLR
5.0000 10*6.[IU] | Freq: Once | INTRAMUSCULAR | Status: AC
  Administered 2015-06-16: 5 10*6.[IU] via INTRAVENOUS
  Filled 2015-06-16: qty 5

## 2015-06-16 MED ORDER — OXYTOCIN BOLUS FROM INFUSION: 500.0000 mL | INTRAVENOUS | Status: DC

## 2015-06-16 MED ORDER — TERBUTALINE SULFATE 1 MG/ML IJ SOLN
0.2500 mg | Freq: Once | INTRAMUSCULAR | Status: DC | PRN
  Filled 2015-06-16: qty 1

## 2015-06-16 MED ORDER — FENTANYL 2.5 MCG/ML BUPIVACAINE 1/10 % EPIDURAL INFUSION (WH - ANES)
INTRAMUSCULAR | Status: AC
  Filled 2015-06-16: qty 125

## 2015-06-16 MED ORDER — ONDANSETRON HCL 4 MG/2ML IJ SOLN: 4.0000 mg | Freq: Four times a day (QID) | INTRAMUSCULAR | Status: DC | PRN

## 2015-06-16 MED ORDER — LIDOCAINE HCL (PF) 1 % IJ SOLN
30.0000 mL | INTRAMUSCULAR | Status: AC | PRN
  Administered 2015-06-17: 30 mL via SUBCUTANEOUS
  Filled 2015-06-16: qty 30

## 2015-06-16 MED ORDER — LACTATED RINGERS IV SOLN
INTRAVENOUS | Status: DC
  Administered 2015-06-17: 10:00:00 via INTRAVENOUS

## 2015-06-16 MED ORDER — LACTATED RINGERS IV SOLN: 500.0000 mL | INTRAVENOUS | Status: DC | PRN

## 2015-06-16 MED ORDER — DIPHENHYDRAMINE HCL 50 MG/ML IJ SOLN: 12.5000 mg | INTRAMUSCULAR | Status: DC | PRN

## 2015-06-16 MED ORDER — OXYCODONE-ACETAMINOPHEN 5-325 MG PO TABS
1.0000 | ORAL_TABLET | ORAL | Status: DC | PRN
  Administered 2015-06-17: 1 via ORAL
  Filled 2015-06-16: qty 1

## 2015-06-16 MED ORDER — OXYCODONE-ACETAMINOPHEN 5-325 MG PO TABS: 2.0000 | ORAL_TABLET | ORAL | Status: DC | PRN

## 2015-06-16 MED ORDER — FLEET ENEMA 7-19 GM/118ML RE ENEM: 1.0000 | ENEMA | RECTAL | Status: DC | PRN

## 2015-06-16 MED ORDER — DEXTROSE 5 % IV SOLN
2.5000 10*6.[IU] | INTRAVENOUS | Status: DC
  Administered 2015-06-16 – 2015-06-17 (×7): 2.5 10*6.[IU] via INTRAVENOUS
  Filled 2015-06-16 (×11): qty 2.5

## 2015-06-16 MED ORDER — FENTANYL 2.5 MCG/ML BUPIVACAINE 1/10 % EPIDURAL INFUSION (WH - ANES)
14.0000 mL/h | INTRAMUSCULAR | Status: DC | PRN
Start: 2015-06-16 — End: 2015-06-17
  Administered 2015-06-16 – 2015-06-17 (×3): 14 mL/h via EPIDURAL
  Filled 2015-06-16 (×2): qty 125

## 2015-06-16 MED ORDER — MISOPROSTOL 25 MCG QUARTER TABLET
25.0000 ug | ORAL_TABLET | ORAL | Status: DC | PRN
  Administered 2015-06-16 (×2): 25 ug via VAGINAL
  Filled 2015-06-16 (×2): qty 0.25
  Filled 2015-06-16: qty 1

## 2015-06-16 MED ORDER — OXYTOCIN 40 UNITS IN LACTATED RINGERS INFUSION - SIMPLE MED
62.5000 mL/h | INTRAVENOUS | Status: DC
  Filled 2015-06-16: qty 1000

## 2015-06-16 MED ORDER — EPHEDRINE 5 MG/ML INJ
10.0000 mg | INTRAVENOUS | Status: DC | PRN
  Filled 2015-06-16: qty 2

## 2015-06-16 NOTE — Progress Notes (Signed)
Patient ID: Karen Burke, female   DOB: 24-Oct-1985, 29 y.o.   MRN: [id number] Doing well, cervix is progressing  Filed Vitals:   06/16/15 0712 06/16/15 0941 06/16/15 0949 06/16/15 1430  BP: 123/86 134/79  129/80  Pulse: 82 85  93  Temp: 98.7 F (37.1 C)   98.6 F (37 C)  TempSrc: Oral   Oral  Resp: Height:    (1.626 m)   Weight:   177 lb (80.287 kg)    FHR reassuring UCs irregular  Dilation: 1 Effacement (%): 80 Station: -2 Presentation: Vertex Exam by:: A.Davis, RN  Will try inserting Foley

## 2015-06-16 NOTE — Anesthesia Procedure Notes (Signed)
Epidural Patient location during procedure: OB Start time: 06/16/2015 11:46 PM End time: 06/16/2015 11:50 PM  Staffing Anesthesiologist: Leilani Able Performed by: anesthesiologist   Preanesthetic Checklist Completed: patient identified, surgical consent, pre-op evaluation, timeout performed, IV checked, risks and benefits discussed and monitors and equipment checked  Epidural Patient position: sitting Prep: site prepped and draped and DuraPrep Patient monitoring: continuous pulse ox and blood pressure Approach: midline Location: L3-L4 Injection technique: LOR air  Needle:  Needle type: Tuohy  Needle gauge: 17 G Needle length: 9 cm and 9 Needle insertion depth: 5 cm cm Catheter type: closed end flexible Catheter size: 19 Gauge Catheter at skin depth: 10 cm Test dose: negative and Other  Assessment Sensory level: T8 Events: blood not aspirated, injection not painful, no injection resistance, negative IV test and no paresthesia  Additional Notes Reason for block:procedure for pain

## 2015-06-16 NOTE — MAU Note (Signed)
C/o ucs since 0100 this AM; husband is Paramedic; denies any vaginal leaking; had some spotting yesterday;

## 2015-06-16 NOTE — Progress Notes (Signed)
Patient ID: Karen Burke, female   DOB: 1986-01-05, 29 y.o.   MRN: [id number] Karen Burke is a 29 y.o. G1P0 at [redacted]w[redacted]d.  Subjective: Very uncomfortable. Requesting IV pain meds.   Objective: BP 118/74 mmHg  Pulse 86  Temp(Src) 98 F (36.7 C) (Oral)  Resp 18  Ht  (1.626 m)  Wt 177 lb (80.287 kg)  BMI 30.37 kg/m2  LMP 09/05/2014 (Exact Date)   FHT:  FHR: 140 bpm, variability: min-mod,  accelerations:  15x15,  decelerations:  none UC:   Q 1-3 minutes, strong  Dilation: 7 (1 cm remaining on maternal left, 4 cm on right) Effacement (%): 80 Station: -1 Presentation: Vertex Exam by:: Dorathy Kinsman CNM   Labs: Results for orders placed or performed during the hospital encounter of 06/16/15 (from the past 24 hour(s))  CBC     Status: Abnormal   Collection Time: 06/16/15  9:20 AM  Result Value Ref Range   WBC 7.9 4.0 - 10.5 K/uL   RBC 3.87 3.87 - 5.11 MIL/uL   Hemoglobin 10.6 (L) 12.0 - 15.0 g/dL   HCT 40.9 (L) 81.1 - 91.4 %   MCV 84.0 78.0 - 100.0 fL   MCH 27.4 26.0 - 34.0 pg   MCHC 32.6 30.0 - 36.0 g/dL   RDW 78.2 (H) 95.6 - 21.3 %   Platelets 184 150 - 400 K/uL  Type and screen     Status: None   Collection Time: 06/16/15  9:20 AM  Result Value Ref Range   ABO/RH(D) O POS    Antibody Screen NEG    Sample Expiration 06/19/2015   ABO/Rh     Status: None   Collection Time: 06/16/15  9:20 AM  Result Value Ref Range   ABO/RH(D) O POS     Assessment / Plan: [redacted]w[redacted]d week IUP Labor: transition Fetal Wellbeing:  Category I Pain Control:  Fentanyl Anticipated MOD:  SVD Encouraged to lie on right side.  Epidural PRN  Dorathy Kinsman, CNM 06/16/2015 11:04 PM

## 2015-06-16 NOTE — MAU Provider Note (Signed)
S:  Ms.Dorotha Palmisano is a 29 y.o. female G1P0 at [redacted]w[redacted]d presenting with contractions. + fetal movement Denies vaginal bleeding.    O:  Filed Vitals:   06/16/15 0712  BP: 123/86  Pulse: 82  Temp: 98.7 F (37.1 C)  TempSrc: Oral  Resp: 18   Dilation: Closed Station: -3 Presentation: Vertex Exam by:: Morrison Old RN  Cervix rechecked after one hour and was unchanged  Fetal Tracing: Baseline: 145 bpm  Variability: Moderate  Accelerations: present  Decelerations: variable decelerations with late decelerations present  Toco: 2-5 mins apart- irregular patter    MDM: LR bolus infusing.  Discussed fetal tracing with Dr. Adrian Blackwater Dr. Adrian Blackwater reviewed fetal tracing Care turned over to [facility], CNM who resumes care of the patient.    Duane Lope, NP 06/16/2015 8:43 AM

## 2015-06-16 NOTE — H&P (Signed)
Karen Burke is a 29 y.o. female G1P0 @ [redacted]w[redacted]d  presenting for contractions. Denies vaginal bleeding, LOF. Maternal Medical History:  Reason for admission: Contractions.   Contractions: Frequency: regular.    Fetal activity: Perceived fetal activity is normal.      Clinic [facility] Prenatal Labs  Dating Dating consistent with u/s Blood type: O/POS/-- (03/10 1431)   Genetic Screen 1 Screen:    AFP:     Quad:     NIPS: Antibody:NEG (03/10 1431)  Anatomic US wnl Rubella: 9.93 (03/10 1431)  GTT Early:            91   Third trimester:  RPR: NON REAC (03/10 1431)   Flu vaccine declined HBsAg: NEGATIVE (03/10 1431)   TDaP vaccine                                               Rhogam: HIV: NONREACTIVE (03/10 1431)   Baby Food                                               ONG:EXBMWUXL (08/16 0000)(For PCN allergy, check sensitivities)  Contraception  Pap:  Circumcision    Pediatrician    Support Person      OB History    Gravida Para Term Preterm AB TAB SAB Ectopic Multiple Living   1              Past Medical History  Diagnosis Date  . Anemia    Past Surgical History  Procedure Laterality Date  . Cyst removal arm     Family History: family history includes Hypertension in her father and mother. Social History:  reports that she has never smoked. She has never used smokeless tobacco. She reports that she does not drink alcohol or use illicit drugs.   Prenatal Transfer Tool  Maternal Diabetes: No Genetic Screening:  Maternal Ultrasounds/Referrals: Normal Fetal Ultrasounds or other Referrals:  None Maternal Substance Abuse:  No Significant Maternal Medications:  None Significant Maternal Lab Results:  Lab values include: Group B Strep positive Other Comments:  None  Review of Systems  Constitutional: Negative for fever.  Gastrointestinal: Positive for abdominal pain.  All other systems reviewed and are negative.   Dilation: Closed Station: -3 Exam by:: Morrison Old RN Blood  pressure 123/86, pulse 82, temperature 98.7 F (37.1 C), temperature source Oral, resp. rate 18, last menstrual period 09/05/2014. Maternal Exam:  Uterine Assessment: Contraction strength is moderate.  Contraction duration is 2 minutes. Contraction frequency is regular.   Abdomen: Patient reports no abdominal tenderness. Cervix: Cervix evaluated by digital exam.     Fetal Exam Fetal Monitor Review: Mode: ultrasound.   Variability: moderate (6-25 bpm).   Pattern: accelerations present, variable decelerations and late decelerations.    Fetal State Assessment: Category II - tracings are indeterminate.     Physical Exam  Nursing note and vitals reviewed. Constitutional: She is oriented to person, place, and time. She appears well-developed and well-nourished. No distress.  HENT:  Head: Normocephalic and atraumatic.  Cardiovascular: Normal rate.   Respiratory: Effort normal. No respiratory distress.  GI: Soft. There is no tenderness.  Musculoskeletal: Normal range of motion.  Neurological: She is alert and oriented to person, place,  and time.  Skin: Skin is warm and dry.  Psychiatric: She has a normal mood and affect. Her behavior is normal. Judgment and thought content normal.    Prenatal labs: ABO, Rh: O/POS/-- (03/10 1431) Antibody: NEG (03/10 1431) Rubella: 9.93 (03/10 1431) RPR: NON REAC (03/10 1431)  HBsAg: NEGATIVE (03/10 1431)  HIV: NONREACTIVE (03/10 1431)  GBS: Positive (08/16 0000)   Assessment/Plan: Admit for Non reassuring FHT's  GBS Positive ABX Prophylaxis for GBS + Cytotec for cervical ripening Anticipate Vaginal Delivery Karen Burke 06/16/2015, 8:59 AM

## 2015-06-16 NOTE — Anesthesia Preprocedure Evaluation (Signed)
Anesthesia Evaluation  Patient identified by MRN, date of birth, ID band Patient awake    Reviewed: Allergy & Precautions, H&P , NPO status , Patient's Chart, lab work & pertinent test results  Airway Mallampati: I  TM Distance: >3 FB Neck ROM: full    Dental no notable dental hx.    Pulmonary neg pulmonary ROS,    Pulmonary exam normal        Cardiovascular negative cardio ROS Normal cardiovascular exam     Neuro/Psych negative neurological ROS  negative psych ROS   GI/Hepatic negative GI ROS, Neg liver ROS,   Endo/Other  negative endocrine ROS  Renal/GU      Musculoskeletal   Abdominal Normal abdominal exam  (+)   Peds  Hematology   Anesthesia Other Findings   Reproductive/Obstetrics (+) Pregnancy                             Anesthesia Physical Anesthesia Plan  ASA: II  Anesthesia Plan: Epidural   Post-op Pain Management:    Induction:   Airway Management Planned:   Additional Equipment:   Intra-op Plan:   Post-operative Plan:   Informed Consent: I have reviewed the patients History and Physical, chart, labs and discussed the procedure including the risks, benefits and alternatives for the proposed anesthesia with the patient or authorized representative who has indicated his/her understanding and acceptance.     Plan Discussed with:   Anesthesia Plan Comments:         Anesthesia Quick Evaluation

## 2015-06-17 ENCOUNTER — Other Ambulatory Visit: Payer: BLUE CROSS/BLUE SHIELD

## 2015-06-17 ENCOUNTER — Encounter (HOSPITAL_COMMUNITY): Payer: Self-pay | Admitting: *Deleted

## 2015-06-17 DIAGNOSIS — O99824 Streptococcus B carrier state complicating childbirth: Secondary | ICD-10-CM

## 2015-06-17 DIAGNOSIS — Z3A40 40 weeks gestation of pregnancy: Secondary | ICD-10-CM

## 2015-06-17 LAB — RPR: RPR Ser Ql: NONREACTIVE

## 2015-06-17 MED ORDER — IBUPROFEN 600 MG PO TABS
600.0000 mg | ORAL_TABLET | Freq: Four times a day (QID) | ORAL | Status: DC
  Administered 2015-06-17 – 2015-06-19 (×6): 600 mg via ORAL
  Filled 2015-06-17 (×7): qty 1

## 2015-06-17 MED ORDER — SIMETHICONE 80 MG PO CHEW: 80.0000 mg | CHEWABLE_TABLET | ORAL | Status: DC | PRN

## 2015-06-17 MED ORDER — BENZOCAINE-MENTHOL 20-0.5 % EX AERO: 1.0000 "application " | INHALATION_SPRAY | CUTANEOUS | Status: DC | PRN

## 2015-06-17 MED ORDER — OXYTOCIN 40 UNITS IN LACTATED RINGERS INFUSION - SIMPLE MED
1.0000 m[IU]/min | INTRAVENOUS | Status: DC
  Administered 2015-06-17: 1 m[IU]/min via INTRAVENOUS

## 2015-06-17 MED ORDER — OXYTOCIN 40 UNITS IN LACTATED RINGERS INFUSION - SIMPLE MED: 62.5000 mL/h | INTRAVENOUS | Status: DC | PRN

## 2015-06-17 MED ORDER — MISOPROSTOL 200 MCG PO TABS
ORAL_TABLET | ORAL | Status: AC
  Filled 2015-06-17: qty 4

## 2015-06-17 MED ORDER — PRENATAL MULTIVITAMIN CH
1.0000 | ORAL_TABLET | Freq: Every day | ORAL | Status: DC
  Administered 2015-06-18 – 2015-06-19 (×2): 1 via ORAL
  Filled 2015-06-17 (×2): qty 1

## 2015-06-17 MED ORDER — ONDANSETRON HCL 4 MG/2ML IJ SOLN: 4.0000 mg | INTRAMUSCULAR | Status: DC | PRN

## 2015-06-17 MED ORDER — MISOPROSTOL 200 MCG PO TABS: 800.0000 ug | ORAL_TABLET | Freq: Once | ORAL | Status: DC

## 2015-06-17 MED ORDER — LIDOCAINE HCL (PF) 1 % IJ SOLN
INTRAMUSCULAR | Status: DC | PRN
  Administered 2015-06-16: 8 mL via EPIDURAL
  Administered 2015-06-16: 9 mL via EPIDURAL

## 2015-06-17 MED ORDER — LANOLIN HYDROUS EX OINT: TOPICAL_OINTMENT | CUTANEOUS | Status: DC | PRN

## 2015-06-17 MED ORDER — OXYTOCIN 40 UNITS IN LACTATED RINGERS INFUSION - SIMPLE MED
1.0000 m[IU]/min | INTRAVENOUS | Status: DC
  Administered 2015-06-17: 2 m[IU]/min via INTRAVENOUS

## 2015-06-17 MED ORDER — OXYTOCIN 40 UNITS IN LACTATED RINGERS INFUSION - SIMPLE MED: 1.0000 m[IU]/min | INTRAVENOUS | Status: DC

## 2015-06-17 MED ORDER — ACETAMINOPHEN 325 MG PO TABS: 650.0000 mg | ORAL_TABLET | ORAL | Status: DC | PRN

## 2015-06-17 MED ORDER — DIBUCAINE 1 % RE OINT: 1.0000 "application " | TOPICAL_OINTMENT | RECTAL | Status: DC | PRN

## 2015-06-17 MED ORDER — WITCH HAZEL-GLYCERIN EX PADS: 1.0000 "application " | MEDICATED_PAD | CUTANEOUS | Status: DC | PRN

## 2015-06-17 MED ORDER — ONDANSETRON HCL 4 MG PO TABS: 4.0000 mg | ORAL_TABLET | ORAL | Status: DC | PRN

## 2015-06-17 MED ORDER — DIPHENHYDRAMINE HCL 25 MG PO CAPS: 25.0000 mg | ORAL_CAPSULE | Freq: Four times a day (QID) | ORAL | Status: DC | PRN

## 2015-06-17 MED ORDER — OXYCODONE-ACETAMINOPHEN 5-325 MG PO TABS: 1.0000 | ORAL_TABLET | ORAL | Status: DC | PRN

## 2015-06-17 MED ORDER — DOCUSATE SODIUM 100 MG PO CAPS
100.0000 mg | ORAL_CAPSULE | Freq: Two times a day (BID) | ORAL | Status: DC
  Administered 2015-06-17 – 2015-06-19 (×4): 100 mg via ORAL
  Filled 2015-06-17 (×4): qty 1

## 2015-06-17 NOTE — Progress Notes (Signed)
Labor Progress Note Karen Burke is a 29 y.o. G1P0 at [redacted]w[redacted]d presented for SOL  S: comfortable with epidural though is feeling ctx in back that are slightly more intense.   O:  BP 139/82 mmHg  Pulse 77  Temp(Src) 99 F (37.2 C) (Oral)  Resp 18  Ht  (1.626 m)  Wt 177 lb (80.287 kg)  BMI 30.37 kg/m2  SpO2 100%  LMP 09/05/2014 (Exact Date) EFM: 145/mod/+accels, no late decelerations since restarting pitocin  CVE: Dilation: 8.5 Effacement (%): 90 Station: 0 Presentation: Vertex Exam by:: Alvester Morin, MD Cervix is nicely symmetric now which per report it had been asymmetric with predominantly posterior.   A&P: 29 y.o. G1P0 [redacted]w[redacted]d here in SOL with protracted 2nd stage #Labor: Pitocin was restart at . Continued increased up-titrate for adequate pattern. Anticipate SVD but concern for protracted 2nd stage, likely related to acynclitic presentation that may be resolved.  Monitor closely.  #Pain: epidural #FWB: Cat I currently #GBS pos- PCN  Federico Flake, MD 10:28 AM

## 2015-06-17 NOTE — Progress Notes (Signed)
Labor Progress Note Karen Burke is a 29 y.o. G1P0 at [redacted]w[redacted]d presented for SOL with protracted 2nd stage  S: feeling back pain and pressure  O:  BP 132/71 mmHg  Pulse 82  Temp(Src) 99.3 F (37.4 C) (Oral)  Resp 20  Ht  (1.626 m)  Wt 177 lb (80.287 kg)  BMI 30.37 kg/m2  SpO2 100%  LMP 09/05/2014 (Exact Date) EFM: 140/mod/+accels, no decels. MVU 150s  CVE: Dilation: Lip/rim Effacement (%): 100 Station: 0 Presentation: Vertex Exam by:: Dr. Alvester Morin   A&P: 29 y.o. G1P0 [redacted]w[redacted]d here with SOL with protracted 2nd stage #Labor: Start of 2nd stage at 14:50. Will push with patient.  #Pain: Epidural #FWB: Cat I, tolerating pitocin well #GBS Pos- on PCN, ROM 06/17/15 0231   Federico Flake, MD 2:30 PM

## 2015-06-17 NOTE — Plan of Care (Signed)
Problem: Phase II Progression Outcomes Goal: Initiate breastfeeding within 1hr delivery Outcome: Not Met (add Reason) Infant to nursery     

## 2015-06-17 NOTE — Progress Notes (Signed)
S: patient resting comfortably now that she had epidural.  O:  Filed Vitals:   06/17/15 0131  BP: 138/89  Pulse: 81  Temp:   Resp:   general: WDWN in NAD Cervical exam: 9.5/100/0; lip only; bulging bag EFM: FHR: 145, moderate variability, +accels,  1 variable decel  A/P: 1. Active labor -s/p epidural -category 1 tracing -bulging membranes -discussed AROM, they would like to hold off at this time so patient can get some rest

## 2015-06-17 NOTE — Progress Notes (Signed)
Patient ID: Karen Burke, female   DOB: [birth year], 29 y.o.   MRN: [id number] Karen Burke is a 29 y.o. G1P0 at [redacted]w[redacted]d.  Subjective: Comfortable w/ epidural. C/O mild HA/   Objective: BP 141/94 mmHg  Pulse 82  Temp(Src) 99.2 F (37.3 C) (Oral)  Resp 18  Ht  (1.626 m)  Wt 177 lb (80.287 kg)  BMI 30.37 kg/m2  SpO2 100%  LMP 09/05/2014 (Exact Date)   FHT:  FHR: 150 bpm, variability: mod,  accelerations:  15x15,  decelerations:  Lates. Resolved w/ fluid bolus, position changes D/C pitocin and O2.  UC:   Q 1-4 minutes, moderate on 2 milliUnits/min pitocin. MVU's 85-140  Dilation: 8 Effacement (%): 80 Station: 0 Presentation: Vertex Exam by:: Corbin Ade CNM  Labs: Results for orders placed or performed during the hospital encounter of 06/16/15 (from the past 24 hour(s))  CBC     Status: Abnormal   Collection Time: 06/16/15  9:20 AM  Result Value Ref Range   WBC 7.9 4.0 - 10.5 K/uL   RBC 3.87 3.87 - 5.11 MIL/uL   Hemoglobin 10.6 (L) 12.0 - 15.0 g/dL   HCT 40.9 (L) 81.1 - 91.4 %   MCV 84.0 78.0 - 100.0 fL   MCH 27.4 26.0 - 34.0 pg   MCHC 32.6 30.0 - 36.0 g/dL   RDW 78.2 (H) 95.6 - 21.3 %   Platelets 184 150 - 400 K/uL  Type and screen     Status: None   Collection Time: 06/16/15  9:20 AM  Result Value Ref Range   ABO/RH(D) O POS    Antibody Screen NEG    Sample Expiration 06/19/2015   RPR     Status: None   Collection Time: 06/16/15  9:20 AM  Result Value Ref Range   RPR Ser Ql Non Reactive Non Reactive  ABO/Rh     Status: None   Collection Time: 06/16/15  9:20 AM  Result Value Ref Range   ABO/RH(D) O POS     Assessment / Plan: [redacted]w[redacted]d week IUP Labor: Protracted active phase Fetal Wellbeing:  Category II Pain Control:  Edpidural Anticipated MOD:  Uncertain. Inadequate labor w/ late decels when pitocin infusing. Discussed concerns w/ Dr. Adrian Blackwater, pt and and FOB. Will restart pitocin at 1 and CTO closely.   [place], CNM 06/17/2015 7:26 AM

## 2015-06-17 NOTE — Plan of Care (Signed)
Problem: Phase II Progression Outcomes Goal: Initiate breastfeeding within 1hr delivery Outcome: Not Met (add Reason) Infant to nursery

## 2015-06-17 NOTE — Progress Notes (Signed)
Labor Progress Note Karen Burke is a 29 y.o. G1P0 at [redacted]w[redacted]d presented for SOL with protracted 2nd stage S: Reports she is more uncomfortable lying back  O:  BP 138/78 mmHg  Pulse 82  Temp(Src) 99.3 F (37.4 C) (Oral)  Resp 18  Ht  (1.626 m)  Wt 177 lb (80.287 kg)  BMI 30.37 kg/m2  SpO2 100%  LMP 09/05/2014 (Exact Date) EFM: 150/mod/+accels, no decels. MVU 80-160  CVE: Dilation: Lip/rim Effacement (%): 100 Station: 0 Presentation: Vertex Exam by:: Dr. Alvester Morin   A&P: 29 y.o. G1P0 [redacted]w[redacted]d here with SOL with protracted 2nd stage #Labor: making slow progress but changing. Suspect malpresentation.  Continue to increase pit for adequate contraction pattern and intensity.  Plan to recheck in 1 hour #Pain: Epidural #FWB: Cat I, tolerating pitocin well #GBS Pos- on PCN, ROM 06/17/15 0231   Federico Flake, MD 1:27 PM

## 2015-06-17 NOTE — Progress Notes (Signed)
Karen Burke is a 29 y.o. G1P0 at 100w5d by LMP admitted for induction of labor due to nonreassuring Fetal heart tracing with few late decels  Subjective: Patient resting comfortably.    Objective: BP 146/95 mmHg   Pulse 86   Temp(Src) 98.5 F (36.9 C) (Oral)   Resp 20   Ht  (1.626 m)   Wt 177 lb (80.287 kg)   BMI 30.37 kg/m2   SpO2 98%   LMP 09/05/2014 (Exact Date)      FHT:  FHR: 140 bpm, variability: moderate,  accelerations:  Present,  decelerations:  Present occasional variable decels UC:   irregular, every 1-3 minutes SVE:   Dilation: Lip/rim Effacement (%): 100 Station: 0 Exam by:: Dr. Redmond Baseman   Labs: Lab Results  Component Value Date   WBC 7.9 06/16/2015   HGB 10.6* 06/16/2015   HCT 32.5* 06/16/2015   MCV 84.0 06/16/2015   PLT 184 06/16/2015    Assessment / Plan: Protracted active phase  Labor:SROM at 0230, protracted active phase with some cervical edema. Placed IUPC. If not having adequate contractions, will augment with pitocin Fetal Wellbeing:  Category II Pain Control:  Epidural I/D:  PCN for GBS+ Anticipated MOD:  NSVD  Durenda Hurt 06/17/2015, 2:56 AM

## 2015-06-17 NOTE — Progress Notes (Signed)
Labor Progress Note Karen Burke is a 29 y.o. G1P0 at [redacted]w[redacted]d presented for SOL with protracted 2nd stage  S: comfortable with epidural.   O:  BP 139/83 mmHg  Pulse 86  Temp(Src) 99 F (37.2 C) (Oral)  Resp 20  Ht  (1.626 m)  Wt 177 lb (80.287 kg)  BMI 30.37 kg/m2  SpO2 100%  LMP 09/05/2014 (Exact Date) EFM: 140/mod/+accels, no late decelerations since restarting pitocin  CVE: Dilation: 8.5 Effacement (%): 90 Station: 0 Presentation: Vertex Exam by:: Alvester Morin, MD   Cervix is nicely symmetric now which per report it had been asymmetric with predominantly posterior.  A&P: 29 y.o. G1P0 [redacted]w[redacted]d here in SOL with protracted 2nd stage #Labor: Pitocin was restarted, currently at 7 milliunits. Unchanged CVE, discussed with attending regarding C-section vs continued augmentations. We will increased pitocin 2x2 as tolerated. Plan to re-check in 2 hours. Discussed briefly possibility of c-section.  #Pain: epidural #FWB: Cat I currently #GBS pos- PCN  Federico Flake, MD 10:41 AM

## 2015-06-18 NOTE — Anesthesia Postprocedure Evaluation (Signed)
  Anesthesia Post-op Note  Patient: Karen Burke  Procedure(s) Performed: * No procedures listed *  Patient Location: Mother/Baby  Anesthesia Type:Epidural  Level of Consciousness: awake, alert , oriented and patient cooperative  Airway and Oxygen Therapy: Patient Spontanous Breathing  Post-op Pain: mild  Post-op Assessment: Post-op Vital signs reviewed, Patient's Cardiovascular Status Stable, Respiratory Function Stable, Patent Airway, No signs of Nausea or vomiting, Adequate PO intake, Pain level controlled, No headache, No backache and Patient able to bend at knees              Post-op Vital Signs: Reviewed and stable  Last Vitals:  Filed Vitals:   06/18/15 0913  BP: 119/70  Pulse: 71  Temp: 36.9 C  Resp: 16    Complications: No apparent anesthesia complications

## 2015-06-18 NOTE — Progress Notes (Signed)
Post Partum Day 1 Subjective:  Karen Burke is a 29 y.o. G1P1001 [redacted]w[redacted]d s/p VD.  No acute events overnight.  Pt denies problems with ambulating, voiding or po intake although endorses she has not gotten up that much.  She denies nausea or vomiting.  Pain is well controlled.  She has had flatus. She has not had bowel movement.  Lochia Small.  Plan for birth control is unsure.  Method of Feeding: breast  Objective: Blood pressure 115/74, pulse 84, temperature 98.1 F (36.7 C), temperature source Oral, resp. rate 18, height  (1.626 m), weight 177 lb (80.287 kg), last menstrual period 09/05/2014, SpO2 100 %, unknown if currently breastfeeding.  Physical Exam:  General: alert, cooperative and no distress Lochia:normal flow Chest: CTAB Heart: RRR no m/r/g Abdomen: +BS, soft, nontender,  Uterine Fundus: firm, below level of umbilicus DVT Evaluation: No evidence of DVT seen on physical exam. Extremities: 1+ pitting edema   Recent Labs  06/16/15 0920  HGB 10.6*  HCT 32.5*    Assessment/Plan:  ASSESSMENT: Karen Burke is a 29 y.o. G1P1001 [redacted]w[redacted]d s/p IVD for non reassuring FHT's  Plan for discharge tomorrow, Breastfeeding and LactatTiara Maultsbyt   LOS: 2 days   Durenda Hurt 06/18/2015, 7:36 AM

## 2015-06-18 NOTE — Lactation Note (Signed)
This note was copied from the chart of Karen Mckaela Howley. Lactation Consultation Note  Mother speaks [place] and family interpreter present. Discussed hand expression.  Mother states she has been taught. Offered to help w/ breastfeeding and mother declined assistance at this time and wants to take a shower. RN recently hand expressed 3ml of colostrum and it was given to baby. Reviewed feeding cues and on demand breastfeeding.   Patient Name: Karen Burke [id number] Date: 06/18/2015     Maternal Data    Feeding Feeding Type: Breast Fed  LATCH Score/Interventions Latch: Repeated attempts needed to sustain latch, nipple held in mouth throughout feeding, stimulation needed to elicit sucking reflex. Intervention(s): Skin to skin;Teach feeding cues;Waking techniques Intervention(s): Adjust position;Assist with latch;Breast compression  Audible Swallowing: None Intervention(s): Skin to skin;Hand expression Intervention(s): Skin to skin;Hand expression  Type of Nipple: Flat Intervention(s): Reverse pressure;Shells;Hand pump;Double electric pump  Comfort (Breast/Nipple): Soft / non-tender     Hold (Positioning): Assistance needed to correctly position infant at breast and maintain latch. Intervention(s): Breastfeeding basics reviewed;Support Pillows;Position options;Skin to skin  LATCH Score: 5  Lactation Tools Discussed/Used Breast pump type: Double-Electric Breast Pump   Consult Status      Hardie Pulley 06/18/2015, 2:20 PM

## 2015-06-18 NOTE — Lactation Note (Signed)
This note was copied from the chart of Karen Karen Burke. Lactation Consultation Note Initial visit at 23 hours of age. FOB interprets for mom.  MBU RN concerned about transfer of milk with latch.  Mom has hand pump, NS and DEBp, but not using consistently.  Baby asleep in crib, FOB reports baby was hungry before going to sleep, stool noted in diaper FOB changed.  Assisted mom with positioning and sitting up in bed.  Assisted with hand expression and mom does not like due to pain, encouraged mom to express herself.  Several drops of colostrum noted.  Baby awakened and placed at the breast STS.  Nipples are tough, edematous, large and flat.  Hand pump stimulated nipple does not evert.  Attempted use of #24 NS, not a good fit due to non compressible tissue.  Mom does not want to use NS.  Mom allows baby very shallow latch to tip of nipple, attempted to assist with deep latch and mom refused, FOB states mom does not like baby deeply latching.  Explained baby can't transfer breastmilk that way and she is not stimulating milk supply.  Mom continues despite warnings of nipple trauma.  Attemped to let baby suck on gloved finger for suck training, FOB reports "mom does not like that."  Plan discussed for mom to pump every 3 hours on preemie setting for 15 minutes and then work on hand expression.  FOB can spoon feed for the next several hours but will need to make a decision about cup or bottle feeding.  Discussed need for formula if baby is not getting enough colostrum.  MBU RN to follow up with feeding and need for formula after pumping.  FOB reports mom does understand despite continuing to allow baby to suck on tip of nipple and understands baby is not transferring milk that way.  FOB appreciative of assistance and reports mom is very particular about baby.  Mom to call [facility] RN as needed for assist.             Patient Name: Karen Burke Date: 06/18/2015 Reason for consult: Initial  assessment   Maternal Data Has patient been taught Hand Expression?: Yes Does the patient have breastfeeding experience prior to this delivery?: No  Feeding Feeding Type: Breast Fed  LATCH Score/Interventions Latch: Repeated attempts needed to sustain latch, nipple held in mouth throughout feeding, stimulation needed to elicit sucking reflex. Intervention(s): Skin to skin;Teach feeding cues;Waking techniques Intervention(s): Adjust position;Assist with latch;Breast massage;Breast compression  Audible Swallowing: None Intervention(s): Skin to skin;Hand expression Intervention(s): Skin to skin;Hand expression  Type of Nipple: Flat Intervention(s): Reverse pressure;Hand pump;Double electric pump  Comfort (Breast/Nipple): Filling, red/small blisters or bruises, mild/mod discomfort  Problem noted: Mild/Moderate discomfort  Hold (Positioning): Assistance needed to correctly position infant at breast and maintain latch. Intervention(s): Breastfeeding basics reviewed;Support Pillows;Position options;Skin to skin  LATCH Score: 4  Lactation Tools Discussed/Used     Consult Status Consult Status: Follow-up Date: 06/19/15 Follow-up type: In-patient    Jannifer Rodney 06/18/2015, 5:05 PM

## 2015-06-19 ENCOUNTER — Inpatient Hospital Stay (HOSPITAL_COMMUNITY): Admission: RE | Admit: 2015-06-19 | Payer: BLUE CROSS/BLUE SHIELD | Source: Ambulatory Visit

## 2015-06-19 MED ORDER — IBUPROFEN 600 MG PO TABS: 600.0000 mg | ORAL_TABLET | Freq: Four times a day (QID) | ORAL | Status: DC | PRN

## 2015-06-19 NOTE — Discharge Summary (Signed)
Obstetric Discharge Summary Reason for Admission: onset of labor Prenatal Procedures: ultrasound Intrapartum Procedures: spontaneous vaginal delivery Postpartum Procedures: none Complications-Operative and Postpartum: 2nd degree perineal laceration  At 5:26 PM a viable female was delivered via Vaginal, Spontaneous Delivery (Presentation: Occiput Anterior).  APGAR: 3, 5; weight 6 lb 7.4 oz (2930 g).   Placenta status: Intact, Spontaneous.  Cord: 3 vessels with the following complications: None.  Cord pH: 7.22  Anesthesia: Epidural  Episiotomy: None Lacerations: 2nd degree;Perineal; Right sulcal Suture Repair: 3.0 Est. Blood Loss (mL): 232   Hospital Course:  Principal Problem:   Spontaneous vaginal delivery Active Problems:   Language barrier   Fetal pyelectasis   Streptococcus b carrier state affecting pregnancy   Active labor   Karen Burke is a 29 y.o. G1P1001 at [redacted]w[redacted]d admitted in [facility]. She had a prolonged/protracted 1st stage of labor with only1 cm of change in 12-13 hours between 9/12 and 9/13. She was put on pitocin 2 times over that period and fetal heart tracing was not-reassuring (Category II tracing) with later decelerations. On 9/13 she was restarted on pitocin and had a Category I strip throughout the day and pitocin was steadily increased. She developed an adequate contraction pattern and progressed to complete at 1450.  The patient pushed from 14:50 until delivery with good maternal effort and fetal descent. With prolonged crowning the infant had deep recurrent variables, nadir in the 70s with good return to baseline between and variability throughout decelerations.Infant's head delivered and had a body cord present which it was delivered through.  Shoulder delivered without problem and infant was initially placed on maternal abdomen and had an initial cry but had non reassuring tone. Cord was cut and infant was transferred to warmer and NICU team arrived.  Infant was noted  to have significant moulding and evidence of acynclitic lie as well likely occiput posterior.  She has postpartum course that was uncomplicated including no problems with ambulating, PO intake, urination, pain, or bleeding. The pt feels ready to go home and  will be discharged with outpatient follow-up.   Today: No acute events overnight.  Pt denies problems with ambulating, voiding or po intake.  She denies nausea or vomiting.  Pain is moderately controlled.  She has had flatus. She has not had bowel movement.  Lochia Small.  Plan for birth control is  no method.  Method of Feeding: Breast. Patient has some complaints of palpitations that occur occasionally since yesterday.  Physical Exam:  General: alert, cooperative, appears stated age and no distress  CV: RRR. No murmurs, gallops, or rubs. Resp: CTAB. No wheezing, rales, or rhonchi. Lochia: appropriate Uterine Fundus: firm Incision: none DVT Evaluation: No evidence of DVT seen on physical exam. Negative Homan's sign.  H/H: Lab Results  Component Value Date/Time   HGB 10.6* 06/16/2015 09:20 AM   HCT 32.5* 06/16/2015 09:20 AM    Discharge Diagnoses: Term Pregnancy-delivered  Discharge Information: Date: 06/19/2015 Activity: pelvic rest Diet: routine  Medications: PNV and Ibuprofen Breast feeding:  Yes Condition: stable Instructions: refer to handout Discharge to: home   Discharge Instructions    Call MD for:  difficulty breathing, headache or visual disturbances    Complete by:  As directed      Call MD for:  extreme fatigue    Complete by:  As directed      Call MD for:  persistant dizziness or light-headedness    Complete by:  As directed      Call MD for:  persistant nausea and vomiting    Complete by:  As directed      Call MD for:  severe uncontrolled pain    Complete by:  As directed      Call MD for:  temperature >100.4    Complete by:  As directed      Diet - low sodium heart healthy    Complete by:  As  directed      Discharge instructions    Complete by:  As directed   Follow up in 6 weeks for routine postpartum check. Nothing in vagina for 6 weeks including no tampons, no douching, no sexual intercourse. Even if you are breast feeding, you can still get pregnant. If planning to have intercourse, please call to schedule follow up sooner to get contraceptive. If having sexual intercourse before appointment, please use condoms although abstinence is recommended as only guarantee to not get pregnant.     Lifting restrictions    Complete by:  As directed   Weight restriction of 15 lbs.            Medication List    TAKE these medications        ibuprofen 600 MG tablet  Commonly known as:  ADVIL,MOTRIN  Take 1 tablet (600 mg total) by mouth every 6 (six) hours as needed for moderate pain or cramping.     Prenatal Vitamins 28-0.8 MG Tabs  Take 1 tablet by mouth daily.      ASK your doctor about these medications        ferrous sulfate 325 (65 FE) MG tablet  Commonly known as:  FERROUSUL  Take 1 tablet (325 mg total) by mouth 2 (two) times daily.           Follow-up Information    Follow up with [facility] In 6 weeks.   Specialty:  Obstetrics and Gynecology   Contact information:   [address] [place] [address] [phone]      Durenda Hurt ,DO PGY2 Resident Physician 06/19/2015,9:58 AM   CNM attestation I have seen and examined this patient and agree with above documentation in the resident's note.   Karen Burke is a 29 y.o. G1P1001 s/p SVD.   Pain is well controlled.  Plan for birth control is no method.  Method of Feeding: breast  PE:  BP 123/52 mmHg  Pulse 79  Temp(Src) 98 F (36.7 C) (Oral)  Resp 18  Ht  (1.626 m)  Wt 80.287 kg (177 lb)  BMI 30.37 kg/m2  SpO2 100%  LMP 09/05/2014 (Exact Date)  Breastfeeding? Unknown Fundus firm  No results for input(s): HGB, HCT in the last 72 hours.   Plan: discharge  today - postpartum care discussed - f/u clinic in 6 weeks for postpartum visit   Cam Hai, CNM 7:43 PM 06/20/2015

## 2015-06-19 NOTE — Discharge Instructions (Signed)

## 2015-06-25 ENCOUNTER — Encounter (HOSPITAL_COMMUNITY): Payer: Self-pay | Admitting: *Deleted

## 2015-06-25 ENCOUNTER — Inpatient Hospital Stay (HOSPITAL_COMMUNITY)
Admission: AD | Admit: 2015-06-25 | Discharge: 2015-06-25 | Disposition: A | Discharge status: Home / Self Care | Payer: BLUE CROSS/BLUE SHIELD | Source: Ambulatory Visit | Attending: Family Medicine | Admitting: Family Medicine

## 2015-06-25 ENCOUNTER — Ambulatory Visit: Payer: BLUE CROSS/BLUE SHIELD | Admitting: *Deleted

## 2015-06-25 VITALS — BP 153/100 | HR 67 | Temp 99.0°F | Ht 60.0 in | Wt 159.0 lb

## 2015-06-25 DIAGNOSIS — O99013 Anemia complicating pregnancy, third trimester: Secondary | ICD-10-CM

## 2015-06-25 DIAGNOSIS — O9089 Other complications of the puerperium, not elsewhere classified: Secondary | ICD-10-CM | POA: Insufficient documentation

## 2015-06-25 DIAGNOSIS — I158 Other secondary hypertension: Secondary | ICD-10-CM | POA: Diagnosis not present

## 2015-06-25 DIAGNOSIS — O165 Unspecified maternal hypertension, complicating the puerperium: Secondary | ICD-10-CM

## 2015-06-25 DIAGNOSIS — O169 Unspecified maternal hypertension, unspecified trimester: Secondary | ICD-10-CM

## 2015-06-25 DIAGNOSIS — Z5189 Encounter for other specified aftercare: Secondary | ICD-10-CM

## 2015-06-25 DIAGNOSIS — Z0130 Encounter for examination of blood pressure without abnormal findings: Secondary | ICD-10-CM

## 2015-06-25 LAB — CBC
HEMATOCRIT: 31.3 % — AB (ref 36.0–46.0)
HEMOGLOBIN: 10 g/dL — AB (ref 12.0–15.0)
MCH: 26.9 pg (ref 26.0–34.0)
MCHC: 31.9 g/dL (ref 30.0–36.0)
MCV: 84.1 fL (ref 78.0–100.0)
Platelets: 318 10*3/uL (ref 150–400)
RBC: 3.72 MIL/uL — ABNORMAL LOW (ref 3.87–5.11)
RDW: 18.4 % — ABNORMAL HIGH (ref 11.5–15.5)
WBC: 6.2 10*3/uL (ref 4.0–10.5)

## 2015-06-25 LAB — COMPREHENSIVE METABOLIC PANEL
ALBUMIN: 3.2 g/dL — AB (ref 3.5–5.0)
ALK PHOS: 146 U/L — AB (ref 38–126)
ALT: 38 U/L (ref 14–54)
ANION GAP: 6 (ref 5–15)
AST: 27 U/L (ref 15–41)
BILIRUBIN TOTAL: 0.1 mg/dL — AB (ref 0.3–1.2)
BUN: 14 mg/dL (ref 6–20)
CALCIUM: 8.5 mg/dL — AB (ref 8.9–10.3)
CO2: 23 mmol/L (ref 22–32)
Chloride: 110 mmol/L (ref 101–111)
Creatinine, Ser: 0.83 mg/dL (ref 0.44–1.00)
GFR calc Af Amer: >60 mL/min (ref >60)
GFR calc non Af Amer: >60 mL/min (ref >60)
GLUCOSE: 86 mg/dL (ref 65–99)
Potassium: 4.1 mmol/L (ref 3.5–5.1)
Sodium: 139 mmol/L (ref 135–145)
TOTAL PROTEIN: 7.1 g/dL (ref 6.5–8.1)

## 2015-06-25 LAB — PROTEIN / CREATININE RATIO, URINE
Creatinine, Urine: 75 mg/dL
PROTEIN CREATININE RATIO: 0.2 mg/mg{creat} — AB (ref 0.00–0.15)
Total Protein, Urine: 15 mg/dL

## 2015-06-25 MED ORDER — PRENATAL VITAMINS 0.8 MG PO TABS: 1.0000 | ORAL_TABLET | Freq: Every day | ORAL | Status: DC

## 2015-06-25 MED ORDER — TRAMADOL HCL 50 MG PO TABS: 50.0000 mg | ORAL_TABLET | Freq: Four times a day (QID) | ORAL | Status: DC | PRN

## 2015-06-25 MED ORDER — HYDROCHLOROTHIAZIDE 25 MG PO TABS: 25.0000 mg | ORAL_TABLET | Freq: Every day | ORAL | Status: DC

## 2015-06-25 NOTE — MAU Note (Signed)
C/o vaginal pain with concerns about her perineal stitches since delivery; elevated BPs today in clinic; sent from clinic to MAU;

## 2015-06-25 NOTE — Discharge Instructions (Signed)

## 2015-06-25 NOTE — MAU Provider Note (Signed)
History     CSN: [id number]  Arrival date and time: 06/25/15 1610   First Provider Initiated Contact with Patient 06/25/15 1648      Chief Complaint  Patient presents with  . Hypertension  . Postpartum Complications   This is a 29 y.o. female who is 1 week postpartum following a vaginal delivery who presents from the clinic for evaluation of new onset hypertension today. States had a headache yesterday but it is gone now. Has not had any visual disturbances or abdominal pain. Has had swelling of BLE for a few months. No history of hypertension during pregnancy.   She also complains of perineal pain where sutures are located. Thinks it is not healing right. No abnormal bleeding or fever.    Hypertension This is a new problem. The current episode started today. The problem is unchanged. Associated symptoms include headaches (yesterday, not today) and peripheral edema. Pertinent negatives include no anxiety, blurred vision, chest pain, malaise/fatigue, neck pain, palpitations or shortness of breath. There are no associated agents to hypertension. There are no known risk factors for coronary artery disease. Past treatments include nothing. recent delivery.  RN Note:  Expand All Collapse All   C/o vaginal pain with concerns about her perineal stitches since delivery; elevated BPs today in clinic; sent from clinic to MAU;          OB History    Gravida Para Term Preterm AB TAB SAB Ectopic Multiple Living   _0 0 1      Past Medical History  Diagnosis Date  . Anemia     Past Surgical History  Procedure Laterality Date  . Cyst removal arm      Family History  Problem Relation Age of Onset  . Hypertension Mother   . Hypertension Father     Social History  Substance Use Topics  . Smoking status: Never Smoker   . Smokeless tobacco: Never Used  . Alcohol Use: No    Allergies: No Known Allergies  Prescriptions prior to admission  Medication Sig Dispense  Refill Last Dose  . docusate sodium (COLACE) 100 MG capsule Take 100 mg by mouth 2 (two) times daily.   06/24/2015 at Unknown time  . ferrous sulfate (FERROUSUL) 325 (65 FE) MG tablet Take 1 tablet (325 mg total) by mouth 2 (two) times daily. 60 tablet 1 Past Week at Unknown time  . ibuprofen (ADVIL,MOTRIN) 600 MG tablet Take 1 tablet (600 mg total) by mouth every 6 (six) hours as needed for moderate pain or cramping. 30 tablet 0 06/25/2015 at Unknown time  . Prenatal Vit-Fe Fumarate-FA (PRENATAL VITAMINS) 28-0.8 MG TABS Take 1 tablet by mouth daily. 30 tablet 5 06/15/2015 at Unknown time    Review of Systems  Constitutional: Negative for fever, chills and malaise/fatigue.  Eyes: Negative for blurred vision and double vision.  Respiratory: Negative for shortness of breath.   Cardiovascular: Negative for chest pain and palpitations.  Gastrointestinal: Negative for nausea, vomiting and abdominal pain.  Musculoskeletal: Negative for neck pain.  Neurological: Positive for headaches (yesterday, not today). Negative for dizziness, focal weakness, seizures and loss of consciousness.   Physical Exam   Blood pressure 162/92, pulse 62, resp. rate 18, last menstrual period 09/05/2014, unknown if currently breastfeeding.  Physical Exam  Constitutional: She is oriented to person, place, and time. She appears well-developed and well-nourished. No distress.  HENT:  Head: Normocephalic.  Neck: Normal range of motion. Neck supple.  Cardiovascular: Normal rate, regular rhythm and normal heart sounds.  Exam reveals no gallop and no friction rub.   No murmur heard. Respiratory: Effort normal and breath sounds normal. No respiratory distress. She has no wheezes. She has no rales. She exhibits no tenderness.  GI: Soft. She exhibits no distension. There is no tenderness. There is no rebound and no guarding.  Genitourinary:  Vaginal laceration examined Well approximated but there is a small flap that is not No  erethema or drainage other than lochia  Musculoskeletal: Normal range of motion. She exhibits edema (lower extremities bilaterally).  Neurological: She is alert and oriented to person, place, and time. She has normal reflexes. She displays normal reflexes. She exhibits normal muscle tone. Coordination normal.  Skin: Skin is warm and dry.  Psychiatric: She has a normal mood and affect.    MAU Course  Procedures  MDM Preeclampsia labs done  Serial blood pressures measured Results for orders placed or performed during the hospital encounter of 06/25/15 (from the past 72 hour(s))  CBC     Status: Abnormal   Collection Time: 06/25/15  5:08 PM  Result Value Ref Range   WBC 6.2 4.0 - 10.5 K/uL   RBC 3.72 (L) 3.87 - 5.11 MIL/uL   Hemoglobin 10.0 (L) 12.0 - 15.0 g/dL   HCT 31.3 (L) 36.0 - 46.0 %   MCV 84.1 78.0 - 100.0 fL   MCH 26.9 26.0 - 34.0 pg   MCHC 31.9 30.0 - 36.0 g/dL   RDW 18.4 (H) 11.5 - 15.5 %   Platelets 318 150 - 400 K/uL  Comprehensive metabolic panel     Status: Abnormal   Collection Time: 06/25/15  5:08 PM  Result Value Ref Range   Sodium 139 135 - 145 mmol/L   Potassium 4.1 3.5 - 5.1 mmol/L   Chloride 110 101 - 111 mmol/L   CO2 23 22 - 32 mmol/L   Glucose, Bld 86 65 - 99 mg/dL   BUN 14 6 - 20 mg/dL   Creatinine, Ser 0.83 0.44 - 1.00 mg/dL   Calcium 8.5 (L) 8.9 - 10.3 mg/dL   Total Protein 7.1 6.5 - 8.1 g/dL   Albumin 3.2 (L) 3.5 - 5.0 g/dL   AST 27 15 - 41 U/L   ALT 38 14 - 54 U/L   Alkaline Phosphatase 146 (H) 38 - 126 U/L   Total Bilirubin 0.1 (L) 0.3 - 1.2 mg/dL   GFR calc non Af Amer >60 >60 mL/min   GFR calc Af Amer >60 >60 mL/min    Comment: (NOTE) The eGFR has been calculated using the CKD EPI equation. This calculation has not been validated in all clinical situations. eGFR's persistently <60 mL/min signify possible Chronic Kidney Disease.    Anion gap 6 5 - 15  Protein / creatinine ratio, urine     Status: Abnormal   Collection Time: 06/25/15   5:30 PM  Result Value Ref Range   Creatinine, Urine 75.00 mg/dL   Total Protein, Urine 15 mg/dL    Comment: NO NORMAL RANGE ESTABLISHED FOR THIS TEST   Protein Creatinine Ratio 0.20 (H) 0.00 - 0.15 mg/mg[Cre]     Assessment and Plan  A:  Postpartum x 1 week       Postpartum hypertension without evidence of preeclampsia       Healing vaginal laceration with pain  P:  Discharge home       Discussed with Dr Elonda Husky       Will  Rx HCTZ daily for a week and then recheck BP next week (appt made)        Preeclampsia precautions         Husband who is a paramedic states he will check her BP at home too.         Refilled Rx for PNV  [facility] 06/25/2015, 5:18 PM

## 2015-07-01 ENCOUNTER — Ambulatory Visit: Payer: BLUE CROSS/BLUE SHIELD | Admitting: General Practice

## 2015-07-01 VITALS — BP 116/77 | HR 75 | Temp 97.7°F | Ht 65.0 in | Wt 152.9 lb

## 2015-07-01 DIAGNOSIS — Z0130 Encounter for examination of blood pressure without abnormal findings: Secondary | ICD-10-CM

## 2015-07-01 NOTE — Progress Notes (Signed)
Patient here for blood pressure check following MAU visit on 9/21. Patient states through husband for interpreter she has had some headaches but that is not a new symptom. Patient also states she took the medication for 2 days and not since then because her at home blood pressure checks were normal.  BP 116/77. Called Dr Jolayne Panther and discussed with her who states patient needs no further follow up. Informed patient to call us if anything changes and we will see her on 10/14 for pp. Patient verbalizes understanding

## 2015-07-18 ENCOUNTER — Ambulatory Visit: Payer: BLUE CROSS/BLUE SHIELD | Admitting: Obstetrics and Gynecology

## 2015-07-23 ENCOUNTER — Ambulatory Visit (INDEPENDENT_AMBULATORY_CARE_PROVIDER_SITE_OTHER): Payer: BLUE CROSS/BLUE SHIELD | Admitting: Family Medicine

## 2015-07-23 ENCOUNTER — Encounter: Payer: Self-pay | Admitting: Family Medicine

## 2015-07-23 NOTE — Progress Notes (Signed)
Duplicate note

## 2015-07-23 NOTE — Patient Instructions (Signed)
Postpartum Depression and Baby Blues °The postpartum period begins right after the birth of a baby. During this time, there is often a great amount of joy and excitement. It is also a time of many changes in the life of the parents. Regardless of how many times a mother gives birth, each child brings new challenges and dynamics to the family. It is not unusual to have feelings of excitement along with confusing shifts in moods, emotions, and thoughts. All mothers are at risk of developing postpartum depression or the "baby blues." These mood changes can occur right after giving birth, or they may occur many months after giving birth. The baby blues or postpartum depression can be mild or severe. Additionally, postpartum depression can go away rather quickly, or it can be a long-term condition.  °CAUSES °Raised hormone levels and the rapid drop in those levels are thought to be a main cause of postpartum depression and the baby blues. A number of hormones change during and after pregnancy. Estrogen and progesterone usually decrease right after the delivery of your baby. The levels of thyroid hormone and various cortisol steroids also rapidly drop. Other factors that play a role in these mood changes include major life events and genetics.  °RISK FACTORS °If you have any of the following risks for the baby blues or postpartum depression, know what symptoms to watch out for during the postpartum period. Risk factors that may increase the likelihood of getting the baby blues or postpartum depression include: °· Having a personal or family history of depression.   °· Having depression while being pregnant.   °· Having premenstrual mood issues or mood issues related to oral contraceptives. °· Having a lot of life stress.   °· Having marital conflict.   °· Lacking a social support network.   °· Having a baby with special needs.   °· Having health problems, such as diabetes.   °SIGNS AND SYMPTOMS °Symptoms of baby blues  include: °· Brief changes in mood, such as going from extreme happiness to sadness. °· Decreased concentration.   °· Difficulty sleeping.   °· Crying spells, tearfulness.   °· Irritability.   °· Anxiety.   °Symptoms of postpartum depression typically begin within the first month after giving birth. These symptoms include: °· Difficulty sleeping or excessive sleepiness.   °· Marked weight loss.   °· Agitation.   °· Feelings of worthlessness.   °· Lack of interest in activity or food.   °Postpartum psychosis is a very serious condition and can be dangerous. Fortunately, it is rare. Displaying any of the following symptoms is cause for immediate medical attention. Symptoms of postpartum psychosis include:  °· Hallucinations and delusions.   °· Bizarre or disorganized behavior.   °· Confusion or disorientation.   °DIAGNOSIS  °A diagnosis is made by an evaluation of your symptoms. There are no medical or lab tests that lead to a diagnosis, but there are various questionnaires that a health care provider may use to identify those with the baby blues, postpartum depression, or psychosis. Often, a screening tool called the Edinburgh Postnatal Depression Scale is used to diagnose depression in the postpartum period.  °TREATMENT °The baby blues usually goes away on its own in 1-2 weeks. Social support is often all that is needed. You will be encouraged to get adequate sleep and rest. Occasionally, you may be given medicines to help you sleep.  °Postpartum depression requires treatment because it can last several months or longer if it is not treated. Treatment may include individual or group therapy, medicine, or both to address any social, physiological, and psychological   factors that may play a role in the depression. Regular exercise, a healthy diet, rest, and social support may also be strongly recommended.  °Postpartum psychosis is more serious and needs treatment right away. Hospitalization is often needed. °HOME CARE  INSTRUCTIONS °· Get as much rest as you can. Nap when the baby sleeps.   °· Exercise regularly. Some women find yoga and walking to be beneficial.   °· Eat a balanced and nourishing diet.   °· Do little things that you enjoy. Have a cup of tea, take a bubble bath, read your favorite magazine, or listen to your favorite music. °· Avoid alcohol.   °· Ask for help with household chores, cooking, grocery shopping, or running errands as needed. Do not try to do everything.   °· Talk to people close to you about how you are feeling. Get support from your partner, family members, friends, or other new moms. °· Try to stay positive in how you think. Think about the things you are grateful for.   °· Do not spend a lot of time alone.   °· Only take over-the-counter or prescription medicine as directed by your health care provider. °· Keep all your postpartum appointments.   °· Let your health care provider know if you have any concerns.   °SEEK MEDICAL CARE IF: °You are having a reaction to or problems with your medicine. °SEEK IMMEDIATE MEDICAL CARE IF: °· You have suicidal feelings.   °· You think you may harm the baby or someone else. °MAKE SURE YOU: °· Understand these instructions. °· Will watch your condition. °· Will get help right away if you are not doing well or get worse. °  °This information is not intended to replace advice given to you by your health care provider. Make sure you discuss any questions you have with your health care provider. °  °Document Released: 06/24/2004 Document Revised: 09/25/2013 Document Reviewed: 07/02/2013 °[employer] Interactive Patient Education ©2016 [employer] ° °

## 2015-07-23 NOTE — Progress Notes (Signed)
Patient ID: Karen Burke, female   DOB: 11/04/1985, 29 y.o.   MRN: [id number] Subjective:     Karen Burke is a 29 y.o. female who presents for a postpartum visit. She is 5 weeks postpartum following a spontaneous vaginal delivery. I have fully reviewed the prenatal and intrapartum course. The delivery was at 8049w5d gestational weeks.  Patient with prolonged pushing, malpresentation, fever with confirmed chorio on pathology.  Outcome: spontaneous vaginal delivery. Anesthesia: epidural. Postpartum course has been uncomplicated. Baby's course has been uncomplicated but did have poor transition. Baby is feeding by both breast and bottle. Bleeding staining only. Bowel function is normal. Bladder function is normal. Patient is sexually active. Contraception method is none. Postpartum depression screening: negative.  The following portions of the patient's history were reviewed and updated as appropriate: allergies, current medications, past family history, past medical history, past social history, past surgical history and problem list.  Review of Systems Pertinent items are noted in HPI.   Objective:    BP 124/91 mmHg  Pulse 74  Temp(Src) 97.7 F (36.5 C) (Oral)  Wt 155 lb 6.4 oz (70.489 kg)  General:  alert, cooperative and appears stated age   Breasts:  inspection negative, no nipple discharge or bleeding, no masses or nodularity palpable  Lungs: normal WOB  Heart:  RR  Abdomen: soft, non-tender; bowel sounds normal; no masses,  no organomegaly   Vulva:  normal  Vagina: normal vagina  Cervix:  anteverted  Corpus: normal  Adnexa:  normal adnexa  Rectal Exam: Normal rectovaginal exam        Assessment:    postpartum exam. Pap smear done at today's visit.   Plan:    1.Contraception: none. Discussed at length but patient does not desire any pregnancy prevention at this time 2. Obstetrical laceration: There is a defect in the patient's right inferior labia minora. Patient noticed this but  is not bothered by it. Discussed removal of this defect but declines.  3. Follow up in: several weeks or as needed.

## 2015-10-05 NOTE — L&D Delivery Note (Addendum)
Patient is 30 y.o. G2P1001 5775w3d admitted for SOL.   Delivery Note At 7:31 PM a viable female was delivered via Vaginal, Spontaneous Delivery (Presentation: Right Occiput Anterior  ).  APGAR: 8, 9; weight pending .   Placenta status: spontaneous and intact.  Cord: 3 vessels  with the following complications: none.    Anesthesia:  No epidural  Episiotomy: None Lacerations: 1st degree;Perineal Suture Repair: 3.0 vicryl Est. Blood Loss (mL): 150  Mom to postpartum.  Baby to Couplet care / Skin to Skin.  De HollingsheadCatherine L Wallace 05/31/2016, 7:51 PM   I was gloved and present for entire delivery SVD without incident No difficulty with shoulders Lacerations as listed above Repair of same supervised by me Aviva SignsMarie L Vestal Crandall, CNM

## 2016-01-23 LAB — OB RESULTS CONSOLE ABO/RH: RH TYPE: POSITIVE

## 2016-01-23 LAB — OB RESULTS CONSOLE HEPATITIS B SURFACE ANTIGEN: Hepatitis B Surface Ag: NEGATIVE

## 2016-01-23 LAB — OB RESULTS CONSOLE PLATELET COUNT: PLATELETS: 277 10*3/uL

## 2016-01-23 LAB — OB RESULTS CONSOLE HGB/HCT, BLOOD
HEMATOCRIT: 33 %
HEMOGLOBIN: 10.7 g/dL

## 2016-01-23 LAB — OB RESULTS CONSOLE ANTIBODY SCREEN: Antibody Screen: NEGATIVE

## 2016-01-23 LAB — OB RESULTS CONSOLE HIV ANTIBODY (ROUTINE TESTING): HIV: NONREACTIVE

## 2016-01-23 LAB — OB RESULTS CONSOLE RPR: RPR: NONREACTIVE

## 2016-02-20 ENCOUNTER — Encounter: Payer: Self-pay | Admitting: *Deleted

## 2016-02-20 DIAGNOSIS — O0990 Supervision of high risk pregnancy, unspecified, unspecified trimester: Secondary | ICD-10-CM | POA: Insufficient documentation

## 2016-02-22 ENCOUNTER — Encounter: Payer: Self-pay | Admitting: Obstetrics & Gynecology

## 2016-02-22 DIAGNOSIS — R8271 Bacteriuria: Secondary | ICD-10-CM | POA: Insufficient documentation

## 2016-02-23 ENCOUNTER — Ambulatory Visit (INDEPENDENT_AMBULATORY_CARE_PROVIDER_SITE_OTHER): Payer: BLUE CROSS/BLUE SHIELD | Admitting: Obstetrics & Gynecology

## 2016-02-23 ENCOUNTER — Encounter: Payer: Self-pay | Admitting: Obstetrics & Gynecology

## 2016-02-23 VITALS — BP 115/74 | HR 101 | Wt 174.0 lb

## 2016-02-23 DIAGNOSIS — O0992 Supervision of high risk pregnancy, unspecified, second trimester: Secondary | ICD-10-CM

## 2016-02-23 LAB — POCT URINALYSIS DIP (DEVICE)
Bilirubin Urine: NEGATIVE
GLUCOSE, UA: NEGATIVE mg/dL
Hgb urine dipstick: NEGATIVE
KETONES UR: NEGATIVE mg/dL
LEUKOCYTES UA: NEGATIVE
Nitrite: NEGATIVE
Protein, ur: NEGATIVE mg/dL
Urobilinogen, UA: 0.2 mg/dL (ref 0.0–1.0)
pH: 5.5 (ref 5.0–8.0)

## 2016-02-23 NOTE — Progress Notes (Signed)
[facility] transferred here by her own request.  1hr gtt today.

## 2016-02-23 NOTE — Progress Notes (Signed)
   Subjective:    Karen Burke is a G2P1001 6816w3d being seen today for her first obstetrical visit.  Her obstetrical history is significant for short interval between pregnancies. Patient does intend to breast feed. Pregnancy history fully reviewed.  Patient reports no complaints.  Filed Vitals:   02/23/16 0835  BP: 115/74  Pulse: 101  Weight: 174 lb (78.926 kg)    HISTORY: OB History  Gravida Para Term Preterm AB SAB TAB Ectopic Multiple Living  2 1 1       0 1    # Outcome Date GA Lbr Len/2nd Weight Sex Delivery Anes PTL Lv  2 Current           1 Term 06/17/15 3053w5d 24:51 / 02:35 6 lb 7.4 oz (2.93 kg) F Vag-Spont EPI  Y     Past Medical History  Diagnosis Date  . Anemia    Past Surgical History  Procedure Laterality Date  . Cyst removal arm     Family History  Problem Relation Age of Onset  . Hypertension Mother   . Hypertension Father      Exam    Uterus:     Pelvic Exam:    Perineum: Pelvic deferred ([facility])   Vulva: Pelvic deferred (had initial prenatal at [facility])   Vagina:  Pelvic deferred (had initial prenatal at [facility])   pH: n/a   Cervix: Pelvic deferred (had initial prenatal at [facility])   Adnexa: Pelvic deferred (had initial prenatal at [facility])   Bony Pelvis: Pelvic deferred (had initial prenatal at [facility])  System: Breast:  Pt not undressed and does not want a breast exam   Skin: normal coloration and turgor, no rashes    Neurologic: oriented, normal mood   Extremities: no deformities   HEENT sclera clear, anicteric   Mouth/Teeth mucous membranes moist, pharynx normal without lesions and dental hygiene good   Neck supple and no masses   Cardiovascular: regular rate and rhythm   Respiratory:  appears well, vitals normal, no respiratory distress, acyanotic, normal RR   Abdomen: soft, non-tender; bowel sounds normal; no masses,  no organomegaly   Urinary: Pelvic deferred (had initial prenatal at  [facility])      Assessment:    Pregnancy: G2P1001 Patient Active Problem List   Diagnosis Date Noted  . GBS bacteriuria 02/22/2016  . Supervision of high risk pregnancy, antepartum 02/20/2016  . Language barrier 12/17/2014        Plan:   Initial labs drawn. Prenatal vitamins. Problem list reviewed and updated. [facility] and nml Ultrasound discussed; fetal survey: Ordered today Follow up in 4 weeks. Abnml pap:  ASCUS, HPV positive--colpo pp per ASCCP guidelines GBS + urine--treated in [place].  RX in labor as well.   Karen Dominey H. 02/23/2016

## 2016-02-23 NOTE — Patient Instructions (Signed)
Round Ligament Pain  The round ligament is a cord of muscle and tissue that helps to support the uterus. It can become a source of pain during pregnancy if it becomes stretched or twisted as the baby grows. The pain usually begins in the second trimester of pregnancy, and it can come and go until the baby is delivered. It is not a serious problem, and it does not cause harm to the baby.  Round ligament pain is usually a short, sharp, and pinching pain, but it can also be a dull, lingering, and aching pain. The pain is felt in the lower side of the abdomen or in the groin. It usually starts deep in the groin and moves up to the outside of the hip area. Pain can occur with:   A sudden change in position.   Rolling over in bed.   Coughing or sneezing.   Physical activity.  HOME CARE INSTRUCTIONS  Watch your condition for any changes. Take these steps to help with your pain:   When the pain starts, relax. Then try:    Sitting down.    Flexing your knees up to your abdomen.    Lying on your side with one pillow under your abdomen and another pillow between your legs.    Sitting in a warm bath for 15-20 minutes or until the pain goes away.   Take over-the-counter and prescription medicines only as told by your health care provider.   Move slowly when you sit and stand.   Avoid long walks if they cause pain.   Stop or lessen your physical activities if they cause pain.  SEEK MEDICAL CARE IF:   Your pain does not go away with treatment.   You feel pain in your back that you did not have before.   Your medicine is not helping.  SEEK IMMEDIATE MEDICAL CARE IF:   You develop a fever or chills.   You develop uterine contractions.   You develop vaginal bleeding.   You develop nausea or vomiting.   You develop diarrhea.   You have pain when you urinate.     This information is not intended to replace advice given to you by your health care provider. Make sure you discuss any questions you have with your health  care provider.     Document Released: 06/29/2008 Document Revised: 12/13/2011 Document Reviewed: 11/27/2014  Elsevier Interactive Patient Education 2016 Elsevier Inc.

## 2016-02-23 NOTE — Progress Notes (Signed)
U/S scheduled 05/31 @ 1245

## 2016-02-24 LAB — RUBELLA SCREEN: RUBELLA: 9.12 {index} — AB (ref <0.90)

## 2016-02-24 LAB — GLUCOSE TOLERANCE, 1 HOUR (50G) W/O FASTING: Glucose, 1 Hr, gestational: 103 mg/dL (ref <140)

## 2016-02-25 ENCOUNTER — Encounter (HOSPITAL_COMMUNITY): Payer: Self-pay | Admitting: Obstetrics & Gynecology

## 2016-03-03 ENCOUNTER — Ambulatory Visit (HOSPITAL_COMMUNITY)
Admission: RE | Admit: 2016-03-03 | Discharge: 2016-03-03 | Disposition: A | Discharge status: Home / Self Care | Payer: BLUE CROSS/BLUE SHIELD | Source: Ambulatory Visit | Attending: Obstetrics & Gynecology | Admitting: Obstetrics & Gynecology

## 2016-03-03 ENCOUNTER — Encounter (HOSPITAL_COMMUNITY): Payer: Self-pay

## 2016-03-03 DIAGNOSIS — Z3A26 26 weeks gestation of pregnancy: Secondary | ICD-10-CM | POA: Diagnosis not present

## 2016-03-03 DIAGNOSIS — O0992 Supervision of high risk pregnancy, unspecified, second trimester: Secondary | ICD-10-CM | POA: Insufficient documentation

## 2016-03-04 ENCOUNTER — Other Ambulatory Visit: Payer: Self-pay | Admitting: Obstetrics & Gynecology

## 2016-03-04 DIAGNOSIS — O0992 Supervision of high risk pregnancy, unspecified, second trimester: Secondary | ICD-10-CM

## 2016-03-09 ENCOUNTER — Ambulatory Visit (INDEPENDENT_AMBULATORY_CARE_PROVIDER_SITE_OTHER): Payer: BLUE CROSS/BLUE SHIELD | Admitting: Student

## 2016-03-09 ENCOUNTER — Encounter: Payer: Self-pay | Admitting: Student

## 2016-03-09 VITALS — BP 124/67 | HR 113 | Wt 176.2 lb

## 2016-03-09 DIAGNOSIS — O0993 Supervision of high risk pregnancy, unspecified, third trimester: Secondary | ICD-10-CM

## 2016-03-09 DIAGNOSIS — Z789 Other specified health status: Secondary | ICD-10-CM

## 2016-03-09 LAB — CBC
HEMATOCRIT: 29 % — AB (ref 35.0–45.0)
Hemoglobin: 9.5 g/dL — ABNORMAL LOW (ref 11.7–15.5)
MCH: 27.1 pg (ref 27.0–33.0)
MCHC: 32.8 g/dL (ref 32.0–36.0)
MCV: 82.9 fL (ref 80.0–100.0)
MPV: 12.1 fL (ref 7.5–12.5)
Platelets: 231 10*3/uL (ref 140–400)
RBC: 3.5 MIL/uL — ABNORMAL LOW (ref 3.80–5.10)
RDW: 15.5 % — AB (ref 11.0–15.0)
WBC: 8.7 10*3/uL (ref 3.8–10.8)

## 2016-03-09 LAB — POCT URINALYSIS DIP (DEVICE)
Bilirubin Urine: NEGATIVE
GLUCOSE, UA: NEGATIVE mg/dL
Hgb urine dipstick: NEGATIVE
Ketones, ur: NEGATIVE mg/dL
Leukocytes, UA: NEGATIVE
Nitrite: NEGATIVE
PH: 6 (ref 5.0–8.0)
Protein, ur: NEGATIVE mg/dL
SPECIFIC GRAVITY, URINE: 1.01 (ref 1.005–1.030)
UROBILINOGEN UA: 0.2 mg/dL (ref 0.0–1.0)

## 2016-03-09 NOTE — Patient Instructions (Signed)

## 2016-03-10 ENCOUNTER — Encounter (HOSPITAL_BASED_OUTPATIENT_CLINIC_OR_DEPARTMENT_OTHER): Payer: Self-pay | Admitting: *Deleted

## 2016-03-10 ENCOUNTER — Inpatient Hospital Stay (HOSPITAL_BASED_OUTPATIENT_CLINIC_OR_DEPARTMENT_OTHER)
Admission: AD | Admit: 2016-03-10 | Discharge: 2016-03-13 | DRG: 781 | Disposition: A | Discharge status: Home / Self Care | Payer: BLUE CROSS/BLUE SHIELD | Source: Ambulatory Visit | Attending: Obstetrics and Gynecology | Admitting: Obstetrics and Gynecology

## 2016-03-10 ENCOUNTER — Telehealth: Payer: Self-pay

## 2016-03-10 DIAGNOSIS — O2303 Infections of kidney in pregnancy, third trimester: Principal | ICD-10-CM | POA: Diagnosis present

## 2016-03-10 DIAGNOSIS — Z8249 Family history of ischemic heart disease and other diseases of the circulatory system: Secondary | ICD-10-CM

## 2016-03-10 DIAGNOSIS — R109 Unspecified abdominal pain: Secondary | ICD-10-CM

## 2016-03-10 DIAGNOSIS — N12 Tubulo-interstitial nephritis, not specified as acute or chronic: Secondary | ICD-10-CM | POA: Diagnosis present

## 2016-03-10 DIAGNOSIS — O99820 Streptococcus B carrier state complicating pregnancy: Secondary | ICD-10-CM | POA: Diagnosis present

## 2016-03-10 DIAGNOSIS — Z3A28 28 weeks gestation of pregnancy: Secondary | ICD-10-CM

## 2016-03-10 DIAGNOSIS — O2302 Infections of kidney in pregnancy, second trimester: Secondary | ICD-10-CM | POA: Diagnosis present

## 2016-03-10 LAB — HIV ANTIBODY (ROUTINE TESTING W REFLEX): HIV 1&2 Ab, 4th Generation: NONREACTIVE

## 2016-03-10 LAB — RPR

## 2016-03-10 NOTE — Progress Notes (Signed)
Subjective:  Karen BouillonMirlene Burke is a 30 y.o. G2P1001 at 259w5d being seen today for ongoing prenatal care.  She is currently monitored for the following issues for this high-risk pregnancy and has Language barrier; Supervision of high risk pregnancy, antepartum; and GBS bacteriuria on her problem list.  Patient reports no complaints.  Contractions: Not present. Vag. Bleeding: None.  Movement: Present. Denies leaking of fluid.   The following portions of the patient's history were reviewed and updated as appropriate: allergies, current medications, past family history, past medical history, past social history, past surgical history and problem list. Problem list updated.  Objective:   Filed Vitals:   03/09/16 1451  BP: 124/67  Pulse: 113  Weight: 176 lb 3.2 oz (79.924 kg)    Fetal Status: Fetal Heart Rate (bpm): 146 Fundal Height: 30 cm Movement: Present     General:  Alert, oriented and cooperative. Patient is in no acute distress.  Skin: Skin is warm and dry. No rash noted.   Cardiovascular: Normal heart rate noted  Respiratory: Normal respiratory effort, no problems with respiration noted  Abdomen: Soft, gravid, appropriate for gestational age. Pain/Pressure: Absent     Pelvic: Vag. Bleeding: None     Cervical exam deferred        Extremities: Normal range of motion.  Edema: None  Mental Status: Normal mood and affect. Normal behavior. Normal judgment and thought content.   Urinalysis:      Assessment and Plan:  Pregnancy: G2P1001 at 799w5d  1. Supervision of high risk pregnancy, antepartum, third trimester  - HIV antibody (with reflex) - CBC - RPR  2. Language barrier   Preterm labor symptoms and general obstetric precautions including but not limited to vaginal bleeding, contractions, leaking of fluid and fetal movement were reviewed in detail with the patient. Please refer to After Visit Summary for other counseling recommendations.  Return in about 2 weeks (around  03/23/2016) for Routine OB.   Judeth HornErin Milley Vining, NP

## 2016-03-10 NOTE — ED Provider Notes (Signed)
CSN: [id number]     Arrival date & time 03/10/16  2212 History  By signing my name below, I, Soijett Blue, attest that this documentation has been prepared under the direction and in the presence of Leta Baptist, MD. Electronically Signed: Soijett Blue, ED Scribe. 03/10/2016. 10:54 PM.   Chief Complaint  Patient presents with  . Abdominal Pain      The history is provided by the patient. No language interpreter was used.    HPI Comments: Karen Burke is a 30 y.o. female who presents to the Emergency Department complaining of progressively worsening, constant, lower abdominal pain onset yesterday. Pt is here due to having back pain that began last night following lifting heavy items while shopping last night. Pt husband states that this is the pt second pregnancy with no issues for the first pregnancy. Pt OB-GYN is at [facility] and the pt is [redacted] weeks pregnant. Pt notes that her abdominal pain is worsened with movement. She states that she is having associated symptoms of intermittent moderate back pain and chills. She states that she has not tried any medications for the relief for her symptoms. She denies vaginal bleeding/discharge, dysuria, hematuria, and any other symptoms. Denies medical issues at this time.   Past Medical History  Diagnosis Date  . Anemia    Past Surgical History  Procedure Laterality Date  . Cyst removal arm     Family History  Problem Relation Age of Onset  . Hypertension Mother   . Hypertension Father    Social History  Substance Use Topics  . Smoking status: Never Smoker   . Smokeless tobacco: Never Used  . Alcohol Use: No   OB History    Gravida Para Term Preterm AB TAB SAB Ectopic Multiple Living   0 1     Review of Systems  Constitutional: Positive for chills.  Gastrointestinal: Positive for abdominal pain.  Genitourinary: Negative for dysuria, hematuria, vaginal bleeding and vaginal discharge.  Musculoskeletal: Positive  for back pain.  All other systems reviewed and are negative.     Allergies  Review of patient's allergies indicates no known allergies.  Home Medications   Prior to Admission medications   Medication Sig Start Date End Date Taking? Authorizing Provider  docusate sodium (COLACE) 100 MG capsule Take 100 mg by mouth 2 (two) times daily. Reported on 03/03/2016    Historical Provider, MD  Prenat w/o A-[id number]-[id number] (CITRANATAL RX) 27-1 MG TABS Take by mouth. Reported on 03/03/2016 01/27/16   Historical Provider, MD   BP 113/79 mmHg  Pulse 132  Temp(Src) 99.3 F (37.4 C) (Oral)  Resp 18  Ht  (1.626 m)  Wt 174 lb (78.926 kg)  BMI 29.85 kg/m2  SpO2 100%  LMP 08/29/2015 (Exact Date) Physical Exam  Constitutional: She is oriented to person, place, and time. She appears well-developed and well-nourished. No distress.  Appears uncomfortable  HENT:  Head: Normocephalic and atraumatic.  Eyes: EOM are normal.  Neck: Neck supple.  Cardiovascular: Regular rhythm and normal heart sounds.  Tachycardia present.  Exam reveals no gallop and no friction rub.   No murmur heard. Pulmonary/Chest: Effort normal and breath sounds normal. No respiratory distress. She has no wheezes. She has no rales.  Abdominal: She exhibits no distension. There is tenderness in the right lower quadrant. There is CVA tenderness (right sided).  gravid  Musculoskeletal: Normal range of motion.  Neurological: She is alert and oriented to  person, place, and time.  Skin: Skin is warm and dry.  Psychiatric: She has a normal mood and affect. Her behavior is normal.  Nursing note and vitals reviewed.   ED Course  Procedures (including critical care time) DIAGNOSTIC STUDIES: Oxygen Saturation is 100% on RA, nl by my interpretation.    COORDINATION OF CARE: 10:53 PM Discussed treatment plan with pt at bedside which includes IV fluids, UA, consult to on-call OB-GYN, and pt agreed to plan.   Labs Review Labs  Reviewed - No data to display  Imaging Review No results found. I have personally reviewed and evaluated these images and lab results as part of my medical decision-making.   EKG Interpretation None      MDM  Patient was seen and evaluated with her husband at bedside. English is the patient's second language and her preferred language is [place]ian. Patient's husband helps to translate. Concern after evaluation for possible abruption versus pyelonephritis versus kidney stone. The patient's husband had wanted to sign her out and take her to women's because he thought that we had OB training and abilities to do full OB evaluations here. I discussed the case on the phone with Dr. Emelda FearFerguson who at this time recommended that the patient be [facility]. I agree with this plan. Husband was instructed to take the patient directly there. He expressed understanding of this. Patient was transferred to the MAU by her husband. Final diagnoses:  None    1. Right flank pain  I personally performed the services described in this documentation, which was scribed in my presence. The recorded information has been reviewed and is accurate.   Leta BaptistEmily Roe Nguyen, MD 03/11/16 [phone]

## 2016-03-10 NOTE — ED Notes (Signed)
Pt c/o abd pain and lower back pain x 1 day 27 weeks preg

## 2016-03-10 NOTE — Progress Notes (Signed)
Pt is a G2P1, [id number], c/o of abd pain. FHT 155 w/accelerations and no decelerations, no contractions seen. Dr. Cyndie ChimeNguyen discussed patient with Dr Myna BrightFergueson. Monitoring d/c'd and [facility] via [facility] vehicle.

## 2016-03-10 NOTE — Telephone Encounter (Signed)
Should be taking iron supplements for anemia  I have called patient and left a message for her to call back us regarding results.

## 2016-03-10 NOTE — ED Notes (Signed)
Per EDP [facility] husband

## 2016-03-10 NOTE — ED Notes (Signed)
Pt a/o x4, no distress, c/o abdominal discomfort, denies vaginal bleeding or discharge.

## 2016-03-11 ENCOUNTER — Encounter: Payer: Self-pay | Admitting: General Practice

## 2016-03-11 ENCOUNTER — Encounter (HOSPITAL_COMMUNITY): Payer: Self-pay | Admitting: *Deleted

## 2016-03-11 DIAGNOSIS — R109 Unspecified abdominal pain: Secondary | ICD-10-CM | POA: Diagnosis present

## 2016-03-11 DIAGNOSIS — Z3A27 27 weeks gestation of pregnancy: Secondary | ICD-10-CM | POA: Diagnosis not present

## 2016-03-11 DIAGNOSIS — Z8249 Family history of ischemic heart disease and other diseases of the circulatory system: Secondary | ICD-10-CM | POA: Diagnosis not present

## 2016-03-11 DIAGNOSIS — O2302 Infections of kidney in pregnancy, second trimester: Secondary | ICD-10-CM | POA: Diagnosis not present

## 2016-03-11 DIAGNOSIS — O99820 Streptococcus B carrier state complicating pregnancy: Secondary | ICD-10-CM | POA: Diagnosis present

## 2016-03-11 DIAGNOSIS — O2303 Infections of kidney in pregnancy, third trimester: Secondary | ICD-10-CM | POA: Diagnosis present

## 2016-03-11 DIAGNOSIS — N12 Tubulo-interstitial nephritis, not specified as acute or chronic: Secondary | ICD-10-CM | POA: Diagnosis present

## 2016-03-11 DIAGNOSIS — Z3A28 28 weeks gestation of pregnancy: Secondary | ICD-10-CM | POA: Diagnosis not present

## 2016-03-11 HISTORY — DX: Infections of kidney in pregnancy, second trimester: O23.02

## 2016-03-11 LAB — COMPREHENSIVE METABOLIC PANEL
ALK PHOS: 94 U/L (ref 38–126)
ALT: 12 U/L — AB (ref 14–54)
AST: 18 U/L (ref 15–41)
Albumin: 2.6 g/dL — ABNORMAL LOW (ref 3.5–5.0)
Anion gap: 9 (ref 5–15)
BILIRUBIN TOTAL: 0.6 mg/dL (ref 0.3–1.2)
BUN: <5 mg/dL — ABNORMAL LOW (ref 6–20)
CALCIUM: 8.5 mg/dL — AB (ref 8.9–10.3)
CO2: 20 mmol/L — AB (ref 22–32)
CREATININE: 0.45 mg/dL (ref 0.44–1.00)
Chloride: 102 mmol/L (ref 101–111)
Glucose, Bld: 105 mg/dL — ABNORMAL HIGH (ref 65–99)
Potassium: 3.6 mmol/L (ref 3.5–5.1)
SODIUM: 131 mmol/L — AB (ref 135–145)
TOTAL PROTEIN: 6.4 g/dL — AB (ref 6.5–8.1)

## 2016-03-11 LAB — CBC WITH DIFFERENTIAL/PLATELET
BASOS PCT: 0 %
Basophils Absolute: 0 10*3/uL (ref 0.0–0.1)
EOS ABS: 0 10*3/uL (ref 0.0–0.7)
Eosinophils Relative: 0 %
HCT: 27.4 % — ABNORMAL LOW (ref 36.0–46.0)
HEMOGLOBIN: 9.2 g/dL — AB (ref 12.0–15.0)
Lymphocytes Relative: 14 %
Lymphs Abs: 1.3 10*3/uL (ref 0.7–4.0)
MCH: 27.1 pg (ref 26.0–34.0)
MCHC: 33.6 g/dL (ref 30.0–36.0)
MCV: 80.6 fL (ref 78.0–100.0)
MONOS PCT: 7 %
Monocytes Absolute: 0.7 10*3/uL (ref 0.1–1.0)
NEUTROS PCT: 79 %
Neutro Abs: 7.7 10*3/uL (ref 1.7–7.7)
PLATELETS: 203 10*3/uL (ref 150–400)
RBC: 3.4 MIL/uL — AB (ref 3.87–5.11)
RDW: 15.1 % (ref 11.5–15.5)
WBC: 9.7 10*3/uL (ref 4.0–10.5)

## 2016-03-11 LAB — URINALYSIS, ROUTINE W REFLEX MICROSCOPIC
BILIRUBIN URINE: NEGATIVE
Glucose, UA: NEGATIVE mg/dL
HGB URINE DIPSTICK: NEGATIVE
NITRITE: NEGATIVE
PROTEIN: 30 mg/dL — AB
Specific Gravity, Urine: 1.02 (ref 1.005–1.030)
pH: 6 (ref 5.0–8.0)

## 2016-03-11 LAB — CBC
HCT: 25.8 % — ABNORMAL LOW (ref 36.0–46.0)
Hemoglobin: 8.3 g/dL — ABNORMAL LOW (ref 12.0–15.0)
MCH: 26.3 pg (ref 26.0–34.0)
MCHC: 32.2 g/dL (ref 30.0–36.0)
MCV: 81.6 fL (ref 78.0–100.0)
PLATELETS: 190 10*3/uL (ref 150–400)
RBC: 3.16 MIL/uL — AB (ref 3.87–5.11)
RDW: 15.3 % (ref 11.5–15.5)
WBC: 10.1 10*3/uL (ref 4.0–10.5)

## 2016-03-11 LAB — TYPE AND SCREEN
ABO/RH(D): O POS
ANTIBODY SCREEN: NEGATIVE

## 2016-03-11 LAB — URINE MICROSCOPIC-ADD ON: RBC / HPF: NONE SEEN RBC/hpf (ref 0–5)

## 2016-03-11 MED ORDER — ACETAMINOPHEN 325 MG PO TABS
650.0000 mg | ORAL_TABLET | Freq: Once | ORAL | Status: AC
  Administered 2016-03-11: 650 mg via ORAL
  Filled 2016-03-11: qty 2

## 2016-03-11 MED ORDER — CALCIUM CARBONATE ANTACID 500 MG PO CHEW
2.0000 | CHEWABLE_TABLET | ORAL | Status: DC | PRN
  Filled 2016-03-11: qty 2

## 2016-03-11 MED ORDER — SODIUM CHLORIDE 0.9 % IV BOLUS (SEPSIS)
1000.0000 mL | Freq: Once | INTRAVENOUS | Status: AC
  Administered 2016-03-11: 1000 mL via INTRAVENOUS

## 2016-03-11 MED ORDER — ZOLPIDEM TARTRATE 5 MG PO TABS
5.0000 mg | ORAL_TABLET | Freq: Every evening | ORAL | Status: DC | PRN
Start: 2016-03-11 — End: 2016-03-13

## 2016-03-11 MED ORDER — PRENATAL MULTIVITAMIN CH
1.0000 | ORAL_TABLET | Freq: Every day | ORAL | Status: DC
  Administered 2016-03-11 – 2016-03-12 (×2): 1 via ORAL
  Filled 2016-03-11 (×3): qty 1

## 2016-03-11 MED ORDER — TRAMADOL HCL 50 MG PO TABS: 50.0000 mg | ORAL_TABLET | Freq: Four times a day (QID) | ORAL | Status: DC | PRN

## 2016-03-11 MED ORDER — DEXTROSE 5 % IV SOLN
2.0000 g | INTRAVENOUS | Status: DC
  Administered 2016-03-11 – 2016-03-13 (×3): 2 g via INTRAVENOUS
  Filled 2016-03-11 (×3): qty 2

## 2016-03-11 MED ORDER — SODIUM CHLORIDE 0.9 % IV SOLN
INTRAVENOUS | Status: DC
  Administered 2016-03-11 – 2016-03-12 (×5): via INTRAVENOUS

## 2016-03-11 MED ORDER — ACETAMINOPHEN 325 MG PO TABS
650.0000 mg | ORAL_TABLET | ORAL | Status: DC | PRN
  Administered 2016-03-11 – 2016-03-12 (×7): 650 mg via ORAL
  Filled 2016-03-11 (×7): qty 2

## 2016-03-11 MED ORDER — DOCUSATE SODIUM 100 MG PO CAPS
100.0000 mg | ORAL_CAPSULE | Freq: Every day | ORAL | Status: DC
  Administered 2016-03-11 – 2016-03-12 (×2): 100 mg via ORAL
  Filled 2016-03-11 (×4): qty 1

## 2016-03-11 NOTE — Progress Notes (Signed)
Spoke with pt through husband who is interpreting for her. Pt finally awake ask about doing Fetal monitoring. Pt refuses states baby is active does not want monitoring. Did explain the need for it. Pt refuses at this time. Ask RN to leave and close the door.

## 2016-03-11 NOTE — Telephone Encounter (Signed)
Called patient, no answer- left message to call us back at the clinics for results. Will send letter

## 2016-03-11 NOTE — Progress Notes (Signed)
Dr. Fawn KirkAnywyou notified that pt is currently refusing fetal monitoring

## 2016-03-11 NOTE — H&P (Signed)
Karen Burke is a 30 y.o. female G2P1001 @ 27.6wks by LMP presenting from the ED for further eval of onset low right abd pain/flank pain x 24 hrs ago. States she began to 'feel hot' this AM. Denies N/V/D; no dysuria. No vag bldg, leaking or ctx. Reports +FM. [facility] and has been remarkable for 1) closely spaced preg (last del 9/16) 2) GBS bacteriuria 3) language barrier (H[place]) 4) anemia  History OB History    Gravida Para Term Preterm AB TAB SAB Ectopic Multiple Living   2 1 1       0 1     Past Medical History  Diagnosis Date  . Anemia    Past Surgical History  Procedure Laterality Date  . Cyst removal arm     Family History: family history includes Hypertension in her father and mother. Social History:  reports that she has never smoked. She has never used smokeless tobacco. She reports that she does not drink alcohol or use illicit drugs.   Prenatal Transfer Tool  Maternal Diabetes: No Genetic Screening: Normal Maternal Ultrasounds/Referrals: Normal Fetal Ultrasounds or other Referrals:  None Maternal Substance Abuse:  No Significant Maternal Medications:  None Significant Maternal Lab Results:  Lab values include: Group B Strep positive Other Comments:  None  ROS  Dilation: Closed Effacement (%): Thick Exam by:: Philipp DeputyKim Shaw CNM  Blood pressure 117/75, pulse 143, temperature 100.4 F (38 C), temperature source Oral, resp. rate 22, height 5\' 4"  (1.626 m), weight 78.926 kg (174 lb), last menstrual period 08/29/2015, SpO2 99 %, unknown if currently breastfeeding. Exam Physical Exam  Constitutional: She is oriented to person, place, and time. She appears well-developed.  HENT:  Head: Normocephalic.  Neck: Normal range of motion.  Cardiovascular:  Tachycardic  Respiratory: Effort normal.  GI: Bowel sounds are normal.  EFM 155-165, +accels, no decels No ctx per toco Neg abd  Genitourinary: Vagina normal.  Musculoskeletal: Normal  range of motion.  Neg CVAT, but some right low back pain  Neurological: She is alert and oriented to person, place, and time.  Skin: Skin is dry.  Skin hot to touch  Psychiatric: She has a normal mood and affect. Her behavior is normal. Thought content normal.    Prenatal labs: ABO, Rh: O/Positive/-- (04/21 0000) Antibody: Negative (04/21 0000) Rubella: 9.12 (05/22 0917) RPR: NON REAC (06/06 1520)  HBsAg: Negative (04/21 0000)  HIV: NONREACTIVE (06/06 1520)  GBS: Positive (08/16 0000)   Assessment/Plan: IUP@27 .6wks Presumed pyelonephritis  Admit to Antenatal Rocephin 2gm q 24h Urine and blood cultures Rpt CBC @ 1500 6/8   SHAW, KIMBERLY CNM 03/11/2016, 3:02 AM

## 2016-03-11 NOTE — MAU Note (Signed)
Repot given to Eastman Kodakadine RN on women's unit. Assigned patient to [id number]

## 2016-03-12 DIAGNOSIS — Z3A27 27 weeks gestation of pregnancy: Secondary | ICD-10-CM

## 2016-03-12 DIAGNOSIS — N12 Tubulo-interstitial nephritis, not specified as acute or chronic: Secondary | ICD-10-CM

## 2016-03-12 DIAGNOSIS — O2302 Infections of kidney in pregnancy, second trimester: Secondary | ICD-10-CM

## 2016-03-12 NOTE — Progress Notes (Signed)
Plan of care discussed with patient. Patient informed of the need to stay through the night. Patient unhappy about having to stay, but cooperative.

## 2016-03-12 NOTE — Progress Notes (Signed)
FACULTY PRACTICE ANTEPARTUM(COMPREHENSIVE) NOTE  Karen BouillonMirlene Burke is a 30 y.o. G2P1001 at 4294w0d by LMP who is admitted for possible pyelonephritis.   Fetal presentation is unsure. Length of Stay:  1  Days  Subjective: Less pain Patient reports the fetal movement as active. Patient reports uterine contraction  activity as none. Patient reports  vaginal bleeding as none. Patient describes fluid per vagina as None.  Vitals:  Blood pressure 128/65, pulse 121, temperature 99.6 F (37.6 C), temperature source Oral, resp. rate 20, height 5\' 4"  (1.626 m), weight 80.741 kg (178 lb), last menstrual period 08/29/2015, SpO2 98 %, unknown if currently breastfeeding. Physical Examination:  General appearance - alert, well appearing, and in no distress Heart - normal rate and regular rhythm Abdomen - soft, nontender, nondistended Fundal Height:  size equals dates No CVAT Extremities: extremities normal,  Membranes:intact  Fetal Monitoring:     Fetal Heart Rate A      Mode  External filed at 03/11/2016 2159    Baseline Rate (A)  130 bpm filed at 03/11/2016 2159    Variability  6-25 BPM filed at 03/11/2016 2159    Accelerations  10 x 10 filed at 03/11/2016 2159    Decelerations  None filed at 03/11/2016 2159      Labs:  Results for orders placed or performed during the hospital encounter of 03/10/16 (from the past 24 hour(s))  CBC   Collection Time: 03/11/16 12:58 PM  Result Value Ref Range   WBC 10.1 4.0 - 10.5 K/uL   RBC 3.16 (L) 3.87 - 5.11 MIL/uL   Hemoglobin 8.3 (L) 12.0 - 15.0 g/dL   HCT 16.125.8 (L) 09.636.0 - 04.546.0 %   MCV 81.6 78.0 - 100.0 fL   MCH 26.3 26.0 - 34.0 pg   MCHC 32.2 30.0 - 36.0 g/dL   RDW 40.915.3 81.111.5 - 91.415.5 %   Platelets 190 150 - 400 K/uL     Medications:  Scheduled . cefTRIAXone (ROCEPHIN)  IV  2 g Intravenous Q24H  . docusate sodium  100 mg Oral Daily  . prenatal multivitamin  1 tablet Oral Q1200   I have reviewed the patient's current  medications.  ASSESSMENT: Patient Active Problem List   Diagnosis Date Noted  . Pyelonephritis affecting pregnancy in second trimester 03/11/2016  . GBS bacteriuria 02/22/2016  . Supervision of high risk pregnancy, antepartum 02/20/2016  . Language barrier 12/17/2014  speaks [place] Creole and some English  PLAN: If afebrile greater than 24 hr reevaluate to switch to po medication. F/U on cultures, currently pending  Karen Burke 03/12/2016,9:04 AM

## 2016-03-12 NOTE — Progress Notes (Signed)
Critical urine culture results discussed with Dr. Debroah LoopArnold. No new orders at this time.

## 2016-03-12 NOTE — Progress Notes (Addendum)
Patient is refusing all fetal monitoring at this time. Pt states "the baby is fine." MD aware.

## 2016-03-13 LAB — URINE CULTURE: Culture: >=100000 — AB

## 2016-03-13 MED ORDER — PRENATAL MULTIVITAMIN CH: 1.0000 | ORAL_TABLET | Freq: Every day | ORAL | Status: AC

## 2016-03-13 MED ORDER — CEPHALEXIN 500 MG PO CAPS: 500.0000 mg | ORAL_CAPSULE | Freq: Every day | ORAL | Status: DC

## 2016-03-13 MED ORDER — AMOXICILLIN 875 MG PO TABS: 875.0000 mg | ORAL_TABLET | Freq: Two times a day (BID) | ORAL | Status: DC

## 2016-03-13 NOTE — Progress Notes (Signed)
Dr. Adrian BlackwaterStinson on unit. Reviewed FHR tracing. Pt to be dc'd home.

## 2016-03-13 NOTE — Discharge Instructions (Signed)
Pyelonephritis, Adult °Pyelonephritis is a kidney infection. The kidneys are organs that help clean your blood by moving waste out of your blood and into your pee (urine). This infection can happen quickly, or it can last for a long time. In most cases, it clears up with treatment and does not cause other problems. °HOME CARE °Medicines °· Take over-the-counter and prescription medicines only as told by your doctor. °· Take your antibiotic medicine as told by your doctor. Do not stop taking the medicine even if you start to feel better. °General Instructions °· Drink enough fluid to keep your pee clear or pale yellow. °· Avoid caffeine, tea, and carbonated drinks. °· Pee (urinate) often. Avoid holding in pee for long periods of time. °· Pee before and after sex. °· After pooping (having a bowel movement), women should wipe from front to back. Use each tissue only once. °· Keep all follow-up visits as told by your doctor. This is important. °GET HELP IF: °· You do not feel better after 2 days. °· Your symptoms get worse. °· You have a fever. °GET HELP RIGHT AWAY IF: °· You cannot take your medicine or drink fluids as told. °· You have chills and shaking. °· You throw up (vomit). °· You have very bad pain in your side (flank) or back. °· You feel very weak or you pass out (faint). °  °This information is not intended to replace advice given to you by your health care provider. Make sure you discuss any questions you have with your health care provider. °  °Document Released: 10/28/2004 Document Revised: 06/11/2015 Document Reviewed: 01/13/2015 °Elsevier Interactive Patient Education ©2016 Elsevier Inc. ° °

## 2016-03-13 NOTE — Discharge Summary (Signed)
OB Discharge Summary     Patient Name: Karen Burke DOB: 1986/01/26 MRN: [id number]  Date of admission: 03/10/2016 Delivering MD: This patient has no babies on file.  Date of discharge: 03/13/2016  Admitting diagnosis: Right flank pain [R10.9] Intrauterine pregnancy: [redacted]w[redacted]d     Secondary diagnosis:  Active Problems:   Pyelonephritis affecting pregnancy in second trimester  Additional problems: none     Discharge diagnosis: pyelonephritis                                                                                                Complications: None  Hospital course:  30yo G2P1001 at 28weeks and 1 day.  She was admitted for pyelonephritis.  She was started on ceftriaxone and received 48 hours of that antibiotic.  She has been afebrile and her symptoms have improved.  She will transition to amoxicillin to complete 10 days, then will be on keflex  PO until end of pregnancy to prevent reoccurrence.    Physical exam  Filed Vitals:   03/12/16 1823 03/12/16 2200 03/13/16 0605 03/13/16 0801  BP: 117/85 114/68 121/76   Pulse: 126 118 100   Temp: 98.4 F (36.9 C) 97.8 F (36.6 C) 98 F (36.7 C)   TempSrc: Oral Oral Oral   Resp: Height:      Weight:    178 lb (80.74 kg)  SpO2: 100% 100% 100%    See Dr Olivia Mackie exam from this morning.  Labs: Lab Results  Component Value Date   WBC 10.1 03/11/2016   HGB 8.3* 03/11/2016   HCT 25.8* 03/11/2016   MCV 81.6 03/11/2016   PLT 190 03/11/2016   CMP Latest Ref Rng 03/11/2016  Glucose 65 - 99 mg/dL 409(W)  BUN 6 - 20 mg/dL <1(X)  Creatinine 9.14 - 1.00 mg/dL 7.82  Sodium 956 - 213 mmol/L 131(L)  Potassium 3.5 - 5.1 mmol/L 3.6  Chloride 101 - 111 mmol/L 102  CO2 22 - 32 mmol/L 20(L)  Calcium 8.9 - 10.3 mg/dL 0.8(M)  Total Protein 6.5 - 8.1 g/dL 6.4(L)  Total Bilirubin 0.3 - 1.2 mg/dL 0.6  Alkaline Phos 38 - 126 U/L 94  AST 15 - 41 U/L 18  ALT 14 - 54 U/L 12(L)    Discharge instruction: per After Visit Summary  and "Baby and Me Booklet".  After visit meds:    Medication List    TAKE these medications        amoxicillin 875 MG tablet  Commonly known as:  AMOXIL  Take 1 tablet (875 mg total) by mouth 2 (two) times daily.     cephALEXin 500 MG capsule  Commonly known as:  KEFLEX  Take 1 capsule (500 mg total) by mouth at bedtime.     prenatal multivitamin Tabs tablet  Take 1 tablet by mouth daily at 12 noon.        Diet: routine diet  Activity: Advance as tolerated. Pelvic rest for 6 weeks.   Outpatient follow up:2 weeks Follow up Appt:Future Appointments Date Time Provider Department Center  03/31/2016 3:05 PM [place] Bing,  MD [facility]   Follow up Visit:No Follow-up on file.  Less than 30 minutes spent on discharge.  03/13/2016 STINSON, JACOB JEHIEL, DO

## 2016-03-13 NOTE — Progress Notes (Signed)
FACULTY PRACTICE ANTEPARTUM(COMPREHENSIVE) NOTE  Karen Burke is a 30 y.o. G2P1001 at 2855w1d by LMP who is admitted for pyelonephritis.   Fetal presentation is unsure. Length of Stay:  2  Days  Subjective: Feels well Patient reports the fetal movement as active. Patient reports uterine contraction  activity as none. Patient reports  vaginal bleeding as none. Patient describes fluid per vagina as None.  Vitals:  Blood pressure 121/76, pulse 100, temperature 98 F (36.7 C), temperature source Oral, resp. rate 18, height 5\' 4"  (1.626 m), weight 80.741 kg (178 lb), last menstrual period 08/29/2015, SpO2 100 %, unknown if currently breastfeeding. Physical Examination:  General appearance - alert, well appearing, and in no distress Heart - normal rate and regular rhythm Abdomen - soft, nontender, non distended no CVAT Fundal Height:  size equals dates Cervical Exam: Not evaluated Extremities: extremities normal, atraumatic, no cyanosis or edema and Homans sign is negative, no sign of DVT  Membranes:intact  Fetal Monitoring:   Fetal Heart Rate A      Mode  External [d/c'd w/Cat 1 tracing] filed at 03/12/2016 2312    Baseline Rate (A)  130 bpm filed at 03/12/2016 2312    Variability  6-25 BPM filed at 03/12/2016 2312    Accelerations  15 x 15 filed at 03/12/2016 2312    Decelerations  None filed at 03/12/2016       Labs:  No results found for this or any previous visit (from the past 24 hour(s)). Urine culture GBS, sensitivities pending   Medications:  Scheduled . cefTRIAXone (ROCEPHIN)  IV  2 g Intravenous Q24H  . docusate sodium  100 mg Oral Daily  . prenatal multivitamin  1 tablet Oral Q1200   I have reviewed the patient's current medications.  ASSESSMENT: Patient Active Problem List   Diagnosis Date Noted  . Pyelonephritis affecting pregnancy in second trimester 03/11/2016  . GBS bacteriuria 02/22/2016  . Supervision of high risk pregnancy, antepartum  02/20/2016  . Language barrier 12/17/2014    PLAN: Pending sensitivities anticipate D/C and rx po antibiotics  ARNOLD,JAMES 03/13/2016,7:45 AM

## 2016-03-13 NOTE — Progress Notes (Signed)
Discharge teaching complete. Pt understood all instructions and did not have any questions. Pt ambulated out of the hospital and discharged home to family.  

## 2016-03-16 LAB — CULTURE, BLOOD (ROUTINE X 2): CULTURE: NO GROWTH

## 2016-03-31 ENCOUNTER — Ambulatory Visit (INDEPENDENT_AMBULATORY_CARE_PROVIDER_SITE_OTHER): Payer: BLUE CROSS/BLUE SHIELD | Admitting: Obstetrics and Gynecology

## 2016-03-31 VITALS — BP 108/58 | HR 100 | Temp 98.7°F | Wt 173.3 lb

## 2016-03-31 DIAGNOSIS — O0993 Supervision of high risk pregnancy, unspecified, third trimester: Secondary | ICD-10-CM

## 2016-03-31 DIAGNOSIS — Z789 Other specified health status: Secondary | ICD-10-CM

## 2016-03-31 DIAGNOSIS — N12 Tubulo-interstitial nephritis, not specified as acute or chronic: Secondary | ICD-10-CM

## 2016-03-31 DIAGNOSIS — R8271 Bacteriuria: Secondary | ICD-10-CM

## 2016-03-31 DIAGNOSIS — O2302 Infections of kidney in pregnancy, second trimester: Secondary | ICD-10-CM

## 2016-03-31 LAB — POCT URINALYSIS DIP (DEVICE)
Bilirubin Urine: NEGATIVE
GLUCOSE, UA: NEGATIVE mg/dL
Hgb urine dipstick: NEGATIVE
KETONES UR: NEGATIVE mg/dL
Leukocytes, UA: NEGATIVE
Nitrite: NEGATIVE
PH: 7 (ref 5.0–8.0)
PROTEIN: NEGATIVE mg/dL
SPECIFIC GRAVITY, URINE: 1.015 (ref 1.005–1.030)
UROBILINOGEN UA: 0.2 mg/dL (ref 0.0–1.0)

## 2016-03-31 NOTE — Progress Notes (Signed)
Subjective:  Karen Burke is a 30 y.o. G2P1001 at 621w5d being seen today for ongoing prenatal care.  She is currently monitored for the following issues for this low-risk pregnancy and has Language barrier; Supervision of high risk pregnancy, antepartum; GBS bacteriuria; and Pyelonephritis affecting pregnancy in second trimester on her problem list.  Patient reports no complaints.   Contractions: Not present. Vag. Bleeding: None.  Movement: Present. Denies leaking of fluid.   The following portions of the patient's history were reviewed and updated as appropriate: allergies, current medications, past family history, past medical history, past social history, past surgical history and problem list. Problem list updated.  Objective:   Filed Vitals:   03/31/16 1507  BP: 108/58  Pulse: 114  Temp: 98.7 F (37.1 C)  Weight: 173 lb 4.8 oz (78.608 kg)    Fetal Status: Fetal Heart Rate (bpm): 150   Movement: Present     General:  Alert, oriented and cooperative. Patient is in no acute distress.  Skin: Skin is warm and dry. No rash noted.   Cardiovascular: Normal heart rate noted  Respiratory: Normal respiratory effort, no problems with respiration noted  Abdomen: Soft, gravid, appropriate for gestational age. Pain/Pressure: Present     Pelvic:  Cervical exam deferred        Extremities: Normal range of motion.  Edema: Trace  Mental Status: Normal mood and affect. Normal behavior. Normal judgment and thought content.   Urinalysis:      Assessment and Plan:  Pregnancy: G2P1001 at 781w5d  1. Pyelonephritis affecting pregnancy in second trimester Patient not taking ppx keflex. Advised to do so qhs to prevent recurrence. If not taking at nv, then would rpt UCx  2. Supervision of high risk pregnancy, antepartum, third trimester Routine care. ?IUD. D/w her re: risks with CI pregnancy  3. GBS bacteriuria tx in labor. See above  4. Language barrier Patient uses husband for interpreter;  waiver signed previously  Preterm labor symptoms and general obstetric precautions including but not limited to vaginal bleeding, contractions, leaking of fluid and fetal movement were reviewed in detail with the patient. Please refer to After Visit Summary for other counseling recommendations.  Return in about 2 weeks (around 04/14/2016).   [place] Bingharlie Akeylah Hendel, MD

## 2016-04-22 ENCOUNTER — Encounter: Payer: BLUE CROSS/BLUE SHIELD | Admitting: Student

## 2016-04-29 ENCOUNTER — Ambulatory Visit (INDEPENDENT_AMBULATORY_CARE_PROVIDER_SITE_OTHER): Payer: BLUE CROSS/BLUE SHIELD | Admitting: Medical

## 2016-04-29 VITALS — BP 107/69 | HR 108 | Wt 175.7 lb

## 2016-04-29 DIAGNOSIS — Z3483 Encounter for supervision of other normal pregnancy, third trimester: Secondary | ICD-10-CM

## 2016-04-29 LAB — POCT URINALYSIS DIP (DEVICE)
BILIRUBIN URINE: NEGATIVE
GLUCOSE, UA: NEGATIVE mg/dL
HGB URINE DIPSTICK: NEGATIVE
KETONES UR: NEGATIVE mg/dL
LEUKOCYTES UA: NEGATIVE
Nitrite: NEGATIVE
Protein, ur: 30 mg/dL — AB
SPECIFIC GRAVITY, URINE: 1.025 (ref 1.005–1.030)
Urobilinogen, UA: 0.2 mg/dL (ref 0.0–1.0)
pH: 7 (ref 5.0–8.0)

## 2016-04-29 NOTE — Patient Instructions (Signed)
Fetal Movement Counts °Patient Name: __________________________________________________ Patient Due Date: ____________________ °Performing a fetal movement count is highly recommended in high-risk pregnancies, but it is good for every pregnant woman to do. Your health care provider may ask you to start counting fetal movements at 28 weeks of the pregnancy. Fetal movements often increase: °· After eating a full meal. °· After physical activity. °· After eating or drinking something sweet or cold. °· At rest. °Pay attention to when you feel the baby is most active. This will help you notice a pattern of your baby's sleep and wake cycles and what factors contribute to an increase in fetal movement. It is important to perform a fetal movement count at the same time each day when your baby is normally most active.  °HOW TO COUNT FETAL MOVEMENTS °1. Find a quiet and comfortable area to sit or lie down on your left side. Lying on your left side provides the best blood and oxygen circulation to your baby. °2. Write down the day and time on a sheet of paper or in a journal. °3. Start counting kicks, flutters, swishes, rolls, or jabs in a 2-hour period. You should feel at least 10 movements within 2 hours. °4. If you do not feel 10 movements in 2 hours, wait 2-3 hours and count again. Look for a change in the pattern or not enough counts in 2 hours. °SEEK MEDICAL CARE IF: °· You feel less than 10 counts in 2 hours, tried twice. °· There is no movement in over an hour. °· The pattern is changing or taking longer each day to reach 10 counts in 2 hours. °· You feel the baby is not moving as he or she usually does. °Date: ____________ Movements: ____________ Start time: ____________ Finish time: ____________  °Date: ____________ Movements: ____________ Start time: ____________ Finish time: ____________ °Date: ____________ Movements: ____________ Start time: ____________ Finish time: ____________ °Date: ____________ Movements:  ____________ Start time: ____________ Finish time: ____________ °Date: ____________ Movements: ____________ Start time: ____________ Finish time: ____________ °Date: ____________ Movements: ____________ Start time: ____________ Finish time: ____________ °Date: ____________ Movements: ____________ Start time: ____________ Finish time: ____________ °Date: ____________ Movements: ____________ Start time: ____________ Finish time: ____________  °Date: ____________ Movements: ____________ Start time: ____________ Finish time: ____________ °Date: ____________ Movements: ____________ Start time: ____________ Finish time: ____________ °Date: ____________ Movements: ____________ Start time: ____________ Finish time: ____________ °Date: ____________ Movements: ____________ Start time: ____________ Finish time: ____________ °Date: ____________ Movements: ____________ Start time: ____________ Finish time: ____________ °Date: ____________ Movements: ____________ Start time: ____________ Finish time: ____________ °Date: ____________ Movements: ____________ Start time: ____________ Finish time: ____________  °Date: ____________ Movements: ____________ Start time: ____________ Finish time: ____________ °Date: ____________ Movements: ____________ Start time: ____________ Finish time: ____________ °Date: ____________ Movements: ____________ Start time: ____________ Finish time: ____________ °Date: ____________ Movements: ____________ Start time: ____________ Finish time: ____________ °Date: ____________ Movements: ____________ Start time: ____________ Finish time: ____________ °Date: ____________ Movements: ____________ Start time: ____________ Finish time: ____________ °Date: ____________ Movements: ____________ Start time: ____________ Finish time: ____________  °Date: ____________ Movements: ____________ Start time: ____________ Finish time: ____________ °Date: ____________ Movements: ____________ Start time: ____________ Finish  time: ____________ °Date: ____________ Movements: ____________ Start time: ____________ Finish time: ____________ °Date: ____________ Movements: ____________ Start time: ____________ Finish time: ____________ °Date: ____________ Movements: ____________ Start time: ____________ Finish time: ____________ °Date: ____________ Movements: ____________ Start time: ____________ Finish time: ____________ °Date: ____________ Movements: ____________ Start time: ____________ Finish time: ____________  °Date: ____________ Movements: ____________ Start time: ____________ Finish   time: ____________ °Date: ____________ Movements: ____________ Start time: ____________ Finish time: ____________ °Date: ____________ Movements: ____________ Start time: ____________ Finish time: ____________ °Date: ____________ Movements: ____________ Start time: ____________ Finish time: ____________ °Date: ____________ Movements: ____________ Start time: ____________ Finish time: ____________ °Date: ____________ Movements: ____________ Start time: ____________ Finish time: ____________ °Date: ____________ Movements: ____________ Start time: ____________ Finish time: ____________  °Date: ____________ Movements: ____________ Start time: ____________ Finish time: ____________ °Date: ____________ Movements: ____________ Start time: ____________ Finish time: ____________ °Date: ____________ Movements: ____________ Start time: ____________ Finish time: ____________ °Date: ____________ Movements: ____________ Start time: ____________ Finish time: ____________ °Date: ____________ Movements: ____________ Start time: ____________ Finish time: ____________ °Date: ____________ Movements: ____________ Start time: ____________ Finish time: ____________ °Date: ____________ Movements: ____________ Start time: ____________ Finish time: ____________  °Date: ____________ Movements: ____________ Start time: ____________ Finish time: ____________ °Date: ____________  Movements: ____________ Start time: ____________ Finish time: ____________ °Date: ____________ Movements: ____________ Start time: ____________ Finish time: ____________ °Date: ____________ Movements: ____________ Start time: ____________ Finish time: ____________ °Date: ____________ Movements: ____________ Start time: ____________ Finish time: ____________ °Date: ____________ Movements: ____________ Start time: ____________ Finish time: ____________ °Date: ____________ Movements: ____________ Start time: ____________ Finish time: ____________  °Date: ____________ Movements: ____________ Start time: ____________ Finish time: ____________ °Date: ____________ Movements: ____________ Start time: ____________ Finish time: ____________ °Date: ____________ Movements: ____________ Start time: ____________ Finish time: ____________ °Date: ____________ Movements: ____________ Start time: ____________ Finish time: ____________ °Date: ____________ Movements: ____________ Start time: ____________ Finish time: ____________ °Date: ____________ Movements: ____________ Start time: ____________ Finish time: ____________ °  °This information is not intended to replace advice given to you by your health care provider. Make sure you discuss any questions you have with your health care provider. °  °Document Released: 10/20/2006 Document Revised: 10/11/2014 Document Reviewed: 07/17/2012 °Elsevier Interactive Patient Education ©2016 [employer] °Braxton Hicks Contractions °Contractions of the uterus can occur throughout pregnancy. Contractions are not always a sign that you are in labor.  °WHAT ARE BRAXTON HICKS CONTRACTIONS?  °Contractions that occur before labor are called Braxton Hicks contractions, or false labor. Toward the end of pregnancy (32-34 weeks), these contractions can develop more often and may become more forceful. This is not true labor because these contractions do not result in opening (dilatation) and thinning of  the cervix. They are sometimes difficult to tell apart from true labor because these contractions can be forceful and people have different pain tolerances. You should not feel embarrassed if you go to the hospital with false labor. Sometimes, the only way to tell if you are in true labor is for your health care provider to look for changes in the cervix. °If there are no prenatal problems or other health problems associated with the pregnancy, it is completely safe to be sent home with false labor and await the onset of true labor. °HOW CAN YOU TELL THE DIFFERENCE BETWEEN TRUE AND FALSE LABOR? °False Labor °· The contractions of false labor are usually shorter and not as hard as those of true labor.   °· The contractions are usually irregular.   °· The contractions are often felt in the front of the lower abdomen and in the groin.   °· The contractions may go away when you walk around or change positions while lying down.   °· The contractions get weaker and are shorter lasting as time goes on.   °· The contractions do not usually become progressively stronger, regular, and closer together as with true labor.   °True Labor °· Contractions in true   labor last 30-70 seconds, become very regular, usually become more intense, and increase in frequency.   °· The contractions do not go away with walking.   °· The discomfort is usually felt in the top of the uterus and spreads to the lower abdomen and low back.   °· True labor can be determined by your health care provider with an exam. This will show that the cervix is dilating and getting thinner.   °WHAT TO REMEMBER °· Keep up with your usual exercises and follow other instructions given by your health care provider.   °· Take medicines as directed by your health care provider.   °· Keep your regular prenatal appointments.   °· Eat and drink lightly if you think you are going into labor.   °· If Braxton Hicks contractions are making you uncomfortable:   °¨ Change your  position from lying down or resting to walking, or from walking to resting.   °¨ Sit and rest in a tub of warm water.   °¨ Drink 2-3 glasses of water. Dehydration may cause these contractions.   °¨ Do slow and deep breathing several times an hour.   °WHEN SHOULD I SEEK IMMEDIATE MEDICAL CARE? °Seek immediate medical care if: °· Your contractions become stronger, more regular, and closer together.   °· You have fluid leaking or gushing from your vagina.   °· You have a fever.   °· You pass blood-tinged mucus.   °· You have vaginal bleeding.   °· You have continuous abdominal pain.   °· You have low back pain that you never had before.   °· You feel your baby's head pushing down and causing pelvic pressure.   °· Your baby is not moving as much as it used to.   °  °This information is not intended to replace advice given to you by your health care provider. Make sure you discuss any questions you have with your health care provider. °  °Document Released: 09/20/2005 Document Revised: 09/25/2013 Document Reviewed: 07/02/2013 °Elsevier Interactive Patient Education ©2016 [employer] ° °

## 2016-04-29 NOTE — Progress Notes (Signed)
+   heartburn Start tums Discussed diet/elevate head of bed Pelvic pain with FM - discussed abd binder

## 2016-05-21 ENCOUNTER — Ambulatory Visit (INDEPENDENT_AMBULATORY_CARE_PROVIDER_SITE_OTHER): Payer: BLUE CROSS/BLUE SHIELD | Admitting: Obstetrics & Gynecology

## 2016-05-21 VITALS — BP 125/72 | HR 102 | Temp 98.1°F | Wt 183.1 lb

## 2016-05-21 DIAGNOSIS — R8271 Bacteriuria: Secondary | ICD-10-CM

## 2016-05-21 DIAGNOSIS — Z789 Other specified health status: Secondary | ICD-10-CM

## 2016-05-21 DIAGNOSIS — Z3493 Encounter for supervision of normal pregnancy, unspecified, third trimester: Secondary | ICD-10-CM

## 2016-05-21 DIAGNOSIS — Z3483 Encounter for supervision of other normal pregnancy, third trimester: Secondary | ICD-10-CM

## 2016-05-21 DIAGNOSIS — O0993 Supervision of high risk pregnancy, unspecified, third trimester: Secondary | ICD-10-CM

## 2016-05-21 LAB — POCT URINALYSIS DIP (DEVICE)
BILIRUBIN URINE: NEGATIVE
Glucose, UA: NEGATIVE mg/dL
Ketones, ur: NEGATIVE mg/dL
NITRITE: NEGATIVE
Protein, ur: NEGATIVE mg/dL
Specific Gravity, Urine: 1.01 (ref 1.005–1.030)
Urobilinogen, UA: 0.2 mg/dL (ref 0.0–1.0)
pH: 7 (ref 5.0–8.0)

## 2016-05-21 NOTE — Progress Notes (Signed)
Subjective:  Karen Burke is a 30 y.o. G2P1001 at 3369w0d being seen today for ongoing prenatal care.  She is currently monitored for the following issues for this low-risk pregnancy and has Language barrier; Supervision of high risk pregnancy, antepartum; GBS bacteriuria; and Pyelonephritis affecting pregnancy in second trimester on her problem list.  Patient reports no complaints.  Contractions: Not present. Vag. Bleeding: None.  Movement: Present. Denies leaking of fluid.   The following portions of the patient's history were reviewed and updated as appropriate: allergies, current medications, past family history, past medical history, past social history, past surgical history and problem list. Problem list updated.  Objective:   Vitals:   05/21/16 0953  BP: 125/72  Pulse: (!) 102  Temp: 98.1 F (36.7 C)  Weight: 183 lb 1.6 oz (83.1 kg)    Fetal Status: Fetal Heart Rate (bpm): 145   Movement: Present     General:  Alert, oriented and cooperative. Patient is in no acute distress.  Skin: Skin is warm and dry. No rash noted.   Cardiovascular: Normal heart rate noted  Respiratory: Normal respiratory effort, no problems with respiration noted  Abdomen: Soft, gravid, appropriate for gestational age. Pain/Pressure: Present     Pelvic:  Cervical exam deferred        Extremities: Normal range of motion.  Edema: None  Mental Status: Normal mood and affect. Normal behavior. Normal judgment and thought content.   Urinalysis:      Assessment and Plan:  Pregnancy: G2P1001 at 769w0d  1. Supervision of normal pregnancy in third trimester  - GC[facility]Chlamydia probe amp [facility]  2. Supervision of high risk pregnancy, antepartum, third trimester   3. GBS bacteriuria - Treat in labor  4. Language barrier   Term labor symptoms and general obstetric precautions including but not limited to vaginal bleeding, contractions, leaking of fluid and fetal movement were reviewed in  detail with the patient. Please refer to After Visit Summary for other counseling recommendations.  Return in about 1 week (around 05/28/2016).   Karen BossierMyra C Gisel Vipond, MD

## 2016-05-24 LAB — GC/CHLAMYDIA PROBE AMP (~~LOC~~) NOT AT ARMC
CHLAMYDIA, DNA PROBE: NEGATIVE
Neisseria Gonorrhea: NEGATIVE

## 2016-05-31 ENCOUNTER — Encounter (HOSPITAL_COMMUNITY): Payer: Self-pay | Admitting: *Deleted

## 2016-05-31 ENCOUNTER — Inpatient Hospital Stay (HOSPITAL_COMMUNITY)
Admission: AD | Admit: 2016-05-31 | Discharge: 2016-06-02 | DRG: 775 | Disposition: A | Discharge status: Home / Self Care | Payer: BLUE CROSS/BLUE SHIELD | Source: Ambulatory Visit | Attending: Family Medicine | Admitting: Family Medicine

## 2016-05-31 DIAGNOSIS — Z8249 Family history of ischemic heart disease and other diseases of the circulatory system: Secondary | ICD-10-CM | POA: Diagnosis not present

## 2016-05-31 DIAGNOSIS — IMO0001 Reserved for inherently not codable concepts without codable children: Secondary | ICD-10-CM

## 2016-05-31 DIAGNOSIS — Z3483 Encounter for supervision of other normal pregnancy, third trimester: Secondary | ICD-10-CM | POA: Diagnosis present

## 2016-05-31 DIAGNOSIS — O99824 Streptococcus B carrier state complicating childbirth: Secondary | ICD-10-CM | POA: Diagnosis present

## 2016-05-31 DIAGNOSIS — Z3A39 39 weeks gestation of pregnancy: Secondary | ICD-10-CM

## 2016-05-31 HISTORY — DX: Reserved for concepts with insufficient information to code with codable children: IMO0002

## 2016-05-31 HISTORY — DX: Unspecified abnormal cytological findings in specimens from vagina: R87.629

## 2016-05-31 LAB — CBC
HEMATOCRIT: 33.4 % — AB (ref 36.0–46.0)
HEMOGLOBIN: 11.1 g/dL — AB (ref 12.0–15.0)
MCH: 26.5 pg (ref 26.0–34.0)
MCHC: 33.2 g/dL (ref 30.0–36.0)
MCV: 79.7 fL (ref 78.0–100.0)
Platelets: 171 10*3/uL (ref 150–400)
RBC: 4.19 MIL/uL (ref 3.87–5.11)
RDW: 18.4 % — ABNORMAL HIGH (ref 11.5–15.5)
WBC: 7.7 10*3/uL (ref 4.0–10.5)

## 2016-05-31 LAB — OB RESULTS CONSOLE GBS: GBS: POSITIVE

## 2016-05-31 LAB — TYPE AND SCREEN
ABO/RH(D): O POS
ANTIBODY SCREEN: NEGATIVE

## 2016-05-31 LAB — OB RESULTS CONSOLE GC/CHLAMYDIA: GC PROBE AMP, GENITAL: NEGATIVE

## 2016-05-31 MED ORDER — ONDANSETRON HCL 4 MG/2ML IJ SOLN: 4.0000 mg | INTRAMUSCULAR | Status: DC | PRN

## 2016-05-31 MED ORDER — ZOLPIDEM TARTRATE 5 MG PO TABS: 5.0000 mg | ORAL_TABLET | Freq: Every evening | ORAL | Status: DC | PRN

## 2016-05-31 MED ORDER — LACTATED RINGERS IV SOLN: 500.0000 mL | INTRAVENOUS | Status: DC | PRN

## 2016-05-31 MED ORDER — OXYTOCIN 40 UNITS IN LACTATED RINGERS INFUSION - SIMPLE MED
2.5000 [IU]/h | INTRAVENOUS | Status: DC
  Filled 2016-05-31: qty 1000

## 2016-05-31 MED ORDER — ACETAMINOPHEN 325 MG PO TABS: 650.0000 mg | ORAL_TABLET | ORAL | Status: DC | PRN

## 2016-05-31 MED ORDER — FENTANYL CITRATE (PF) 100 MCG/2ML IJ SOLN
100.0000 ug | INTRAMUSCULAR | Status: DC | PRN
  Administered 2016-05-31: 100 ug via INTRAVENOUS
  Filled 2016-05-31: qty 2

## 2016-05-31 MED ORDER — OXYTOCIN BOLUS FROM INFUSION
500.0000 mL | Freq: Once | INTRAVENOUS | Status: AC
  Administered 2016-05-31: 500 mL via INTRAVENOUS

## 2016-05-31 MED ORDER — IBUPROFEN 600 MG PO TABS
600.0000 mg | ORAL_TABLET | Freq: Four times a day (QID) | ORAL | Status: DC
  Administered 2016-05-31 – 2016-06-02 (×7): 600 mg via ORAL
  Filled 2016-05-31 (×7): qty 1

## 2016-05-31 MED ORDER — OXYCODONE-ACETAMINOPHEN 5-325 MG PO TABS: 1.0000 | ORAL_TABLET | ORAL | Status: DC | PRN

## 2016-05-31 MED ORDER — PENICILLIN G POTASSIUM 5000000 UNITS IJ SOLR
2.5000 10*6.[IU] | INTRAVENOUS | Status: DC
  Filled 2016-05-31 (×2): qty 2.5

## 2016-05-31 MED ORDER — BENZOCAINE-MENTHOL 20-0.5 % EX AERO
1.0000 "application " | INHALATION_SPRAY | CUTANEOUS | Status: DC | PRN
  Administered 2016-05-31: 1 via TOPICAL
  Filled 2016-05-31: qty 56

## 2016-05-31 MED ORDER — WITCH HAZEL-GLYCERIN EX PADS: 1.0000 "application " | MEDICATED_PAD | CUTANEOUS | Status: DC | PRN

## 2016-05-31 MED ORDER — FLEET ENEMA 7-19 GM/118ML RE ENEM: 1.0000 | ENEMA | RECTAL | Status: DC | PRN

## 2016-05-31 MED ORDER — ONDANSETRON HCL 4 MG PO TABS
4.0000 mg | ORAL_TABLET | ORAL | Status: DC | PRN
Start: 2016-05-31 — End: 2016-06-02

## 2016-05-31 MED ORDER — OXYCODONE-ACETAMINOPHEN 5-325 MG PO TABS
2.0000 | ORAL_TABLET | ORAL | Status: DC | PRN
  Administered 2016-05-31: 2 via ORAL
  Filled 2016-05-31: qty 2

## 2016-05-31 MED ORDER — COCONUT OIL OIL: 1.0000 "application " | TOPICAL_OIL | Status: DC | PRN

## 2016-05-31 MED ORDER — ONDANSETRON HCL 4 MG/2ML IJ SOLN: 4.0000 mg | Freq: Four times a day (QID) | INTRAMUSCULAR | Status: DC | PRN

## 2016-05-31 MED ORDER — PENICILLIN G POTASSIUM 5000000 UNITS IJ SOLR
5.0000 10*6.[IU] | Freq: Once | INTRAVENOUS | Status: AC
  Administered 2016-05-31: 5 10*6.[IU] via INTRAVENOUS
  Filled 2016-05-31: qty 5

## 2016-05-31 MED ORDER — SIMETHICONE 80 MG PO CHEW: 80.0000 mg | CHEWABLE_TABLET | ORAL | Status: DC | PRN

## 2016-05-31 MED ORDER — SOD CITRATE-CITRIC ACID 500-334 MG/5ML PO SOLN: 30.0000 mL | ORAL | Status: DC | PRN

## 2016-05-31 MED ORDER — TETANUS-DIPHTH-ACELL PERTUSSIS 5-2.5-18.5 LF-MCG/0.5 IM SUSP
0.5000 mL | Freq: Once | INTRAMUSCULAR | Status: DC
Start: 2016-06-01 — End: 2016-06-02

## 2016-05-31 MED ORDER — PRENATAL MULTIVITAMIN CH
1.0000 | ORAL_TABLET | Freq: Every day | ORAL | Status: DC
  Administered 2016-06-01 – 2016-06-02 (×2): 1 via ORAL
  Filled 2016-05-31 (×2): qty 1

## 2016-05-31 MED ORDER — SENNOSIDES-DOCUSATE SODIUM 8.6-50 MG PO TABS
2.0000 | ORAL_TABLET | ORAL | Status: DC
  Administered 2016-05-31 – 2016-06-01 (×2): 2 via ORAL
  Filled 2016-05-31 (×2): qty 2

## 2016-05-31 MED ORDER — LIDOCAINE HCL (PF) 1 % IJ SOLN
30.0000 mL | INTRAMUSCULAR | Status: DC | PRN
  Administered 2016-05-31: 30 mL via SUBCUTANEOUS
  Filled 2016-05-31: qty 30

## 2016-05-31 MED ORDER — DIBUCAINE 1 % RE OINT: 1.0000 "application " | TOPICAL_OINTMENT | RECTAL | Status: DC | PRN

## 2016-05-31 MED ORDER — LACTATED RINGERS IV SOLN
INTRAVENOUS | Status: DC
  Administered 2016-05-31: 15:00:00 via INTRAVENOUS

## 2016-05-31 MED ORDER — ACETAMINOPHEN 325 MG PO TABS
650.0000 mg | ORAL_TABLET | ORAL | Status: DC | PRN
Start: 2016-05-31 — End: 2016-06-02

## 2016-05-31 MED ORDER — DIPHENHYDRAMINE HCL 25 MG PO CAPS: 25.0000 mg | ORAL_CAPSULE | Freq: Four times a day (QID) | ORAL | Status: DC | PRN

## 2016-05-31 NOTE — Progress Notes (Signed)
Report called to Steffanie Dunnainey, Charge RN in birthing suites, at [phone].  Pt to be admitted, but no RN to take patient.  Waiting for call back on when patient can be admitted.

## 2016-05-31 NOTE — MAU Note (Signed)
Contractions started last night.  Now 6 minutes apart.  Denies LOF or vaginal bleeding.  Reports +fetal movement.  Last baby was vaginal delivery 5618yr ago.

## 2016-05-31 NOTE — Anesthesia Pain Management Evaluation Note (Addendum)
  [facility]NA Pain Management Visit Note  Patient: Karen Burke, 30 y.o., female  "Hello I am a member of the anesthesia team at [facility]. We have an anesthesia team available at all times to provide care throughout the hospital, including epidural management and anesthesia for C-section. I don't know your plan for the delivery whether it a natural birth, water birth, IV sedation, nitrous supplementation, doula or epidural, but we want to meet your pain goals."   1.Was your pain managed to your expectations on prior hospitalizations?   Yes   2.What is your expectation for pain management during this hospitalization?     Epidural and IV pain meds  3.How can we help you reach that goal? IV pain meds, possibly epidural.  Patient is concerned that receiving an epidural will slow labor progress.  I had an extensive conversation with the patient and her husband, and answered all questions regarding labor progression and epidural.  Patient agreed to IV pain meds for now, but is not ready for an epidural.  L&D RN made aware of patient's desire for pain meds.  Record the patient's initial score and the patient's pain goal.   Pain: 10--during contractions  Pain Goal: 3 [facility] wants you to be able to say your pain was always managed very well.  Azaria Stegman L 05/31/2016

## 2016-05-31 NOTE — H&P (Signed)
  Karen Burke is a 30 y.o. female presenting for SOL at 4260w3d based on LMP. Contractions started last night at 10 pm. Had bloody show in the MAU, no leaking of fluid.    OB History    Gravida Para Term Preterm AB Living   2 1 1     1    SAB TAB Ectopic Multiple Live Births         0 1     Past Medical History:  Diagnosis Date  . Anemia    Past Surgical History:  Procedure Laterality Date  . cyst removal arm     Family History: family history includes Hypertension in her father and mother. Social History:  reports that she has never smoked. She has never used smokeless tobacco. She reports that she does not drink alcohol or use drugs.     Maternal Diabetes: No Genetic Screening: Normal Maternal Ultrasounds/Referrals: Normal Fetal Ultrasounds or other Referrals:  None Maternal Substance Abuse:  No Significant Maternal Medications:  None Significant Maternal Lab Results:  None Other Comments:  None  Review of Systems  Constitutional: Negative.   HENT: Negative.   Eyes: Negative.   Respiratory: Negative.   Cardiovascular: Negative.   Gastrointestinal: Negative.   Genitourinary: Negative.   Musculoskeletal: Negative.   Skin: Negative.   Neurological: Negative.   Endo/Heme/Allergies: Negative.   Psychiatric/Behavioral: Negative.    Maternal Medical History:  Reason for admission: Contractions.   Contractions: Onset was yesterday.    Fetal activity: Perceived fetal activity is normal.   Last perceived fetal movement was within the past hour.    Prenatal Complications - Diabetes: none.    Dilation: 4 Effacement (%): 80 Station: -3 Exam by:: Leafy Rokatie koontz, rnc Blood pressure 126/75, pulse 89, temperature 98.2 F (36.8 C), temperature source Oral, resp. rate 16, height 5\' 4"  (1.626 m), last menstrual period 08/29/2015, SpO2 100 %, unknown if currently breastfeeding.   FHT: Baseline 135, good variability, positive accels., no decels  Exam Physical Exam   Constitutional: She appears well-developed and well-nourished.  Cardiovascular: Normal rate, regular rhythm and normal heart sounds.   Respiratory: Effort normal and breath sounds normal.  GI: Soft.  Skin: Skin is warm.    Prenatal labs: ABO, Rh: --/--/O POS (06/08 0217) Antibody: NEG (06/08 0217) Rubella: 9.12 (05/22 0917) RPR: NON REAC (06/06 1520)  HBsAg: Negative (04/21 0000)  HIV: NONREACTIVE (06/06 1520)  GBS:   POSITIVE  Assessment/Plan: Admit   #Labor:expectant management #pain: Fentanyl if desired #FWB: Category I #ID: GBS pos - penicillin #MOF: breast   Ernestina PennaNicholas Enis Riecke MD

## 2016-05-31 NOTE — Progress Notes (Signed)
Karen Burke is a 30 y.o. female presenting for SOL at 6576w3d based on LMP. Contractions started last night at 10 pm. Had bloody show in the MAU, no leaking of fluid.    OB History    Gravida Para Term Preterm AB Living   2 1 1     1    SAB TAB Ectopic Multiple Live Births         0 1     Past Medical History:  Diagnosis Date  . Anemia    Past Surgical History:  Procedure Laterality Date  . cyst removal arm     Family History: family history includes Hypertension in her father and mother. Social History:  reports that she has never smoked. She has never used smokeless tobacco. She reports that she does not drink alcohol or use drugs.     Maternal Diabetes: No Genetic Screening: Normal Maternal Ultrasounds/Referrals: Normal Fetal Ultrasounds or other Referrals:  None Maternal Substance Abuse:  No Significant Maternal Medications:  None Significant Maternal Lab Results:  None Other Comments:  None  Review of Systems  Constitutional: Negative.   HENT: Negative.   Eyes: Negative.   Respiratory: Negative.   Cardiovascular: Negative.   Gastrointestinal: Negative.   Genitourinary: Negative.   Musculoskeletal: Negative.   Skin: Negative.   Neurological: Negative.   Endo/Heme/Allergies: Negative.   Psychiatric/Behavioral: Negative.    Maternal Medical History:  Reason for admission: Contractions.   Contractions: Onset was yesterday.    Fetal activity: Perceived fetal activity is normal.   Last perceived fetal movement was within the past hour.    Prenatal Complications - Diabetes: none.    Dilation: 4 Effacement (%): 80 Station: -3 Exam by:: Leafy Rokatie koontz, rnc Blood pressure 126/75, pulse 89, temperature 98.2 F (36.8 C), temperature source Oral, resp. rate 16, height 5\' 4"  (1.626 m), last menstrual period 08/29/2015, SpO2 100 %, unknown if currently breastfeeding.   FHT: Baseline 135, good variability, positive accels., no decels  Exam Physical Exam   Constitutional: She appears well-developed and well-nourished.  Cardiovascular: Normal rate, regular rhythm and normal heart sounds.   Respiratory: Effort normal and breath sounds normal.  GI: Soft.  Skin: Skin is warm.    Prenatal labs: ABO, Rh: --/--/O POS (06/08 0217) Antibody: NEG (06/08 0217) Rubella: 9.12 (05/22 0917) RPR: NON REAC (06/06 1520)  HBsAg: Negative (04/21 0000)  HIV: NONREACTIVE (06/06 1520)  GBS:   POSITIVE  Assessment/Plan: Admit   #Labor:expectant management #pain: Fentanyl if desired #FWB: Category I #ID: GBS pos - penicillin #MOF: breast #MOC: TBD #Circ: TBD  Ernst BowlerEdward Y Dalasia Predmore 05/31/2016, 3:01 PM

## 2016-06-01 ENCOUNTER — Encounter: Payer: BLUE CROSS/BLUE SHIELD | Admitting: Family

## 2016-06-01 LAB — RPR: RPR: NONREACTIVE

## 2016-06-01 NOTE — Progress Notes (Signed)
Pt voided without c/o difficulty. Peri care taught. Pt ambulating without difficulty.

## 2016-06-01 NOTE — Progress Notes (Signed)
Post Partum Day 1 Subjective: no complaints, up ad lib, voiding, tolerating PO and + flatus  Objective: Blood pressure 105/62, pulse 91, temperature 98.5 F (36.9 C), temperature source Oral, resp. rate 18, height 5\' 4"  (1.626 m), weight 83 kg (183 lb), last menstrual period 08/29/2015, SpO2 100 %, unknown if currently breastfeeding.  Physical Exam:  General: alert, cooperative and no distress Lochia: appropriate Uterine Fundus: firm DVT Evaluation: No evidence of DVT seen on physical exam. Negative Homan's sign.   Recent Labs  05/31/16 1430  HGB 11.1*  HCT 33.4*    Assessment/Plan: Plan for discharge tomorrow   Considering Circumcision   LOS: 1 day   Karen Burke PGY-1 06/01/2016, 9:24 AM   OB FELLOW DISCHARGE ATTESTATION  I have seen and examined this patient and agree with above documentation in the resident's note.   Karen PennaNicholas Vontae Court, MD 9:32 AM

## 2016-06-02 MED ORDER — SENNOSIDES-DOCUSATE SODIUM 8.6-50 MG PO TABS: 2.0000 | ORAL_TABLET | ORAL | 0 refills | Status: DC

## 2016-06-02 MED ORDER — IBUPROFEN 600 MG PO TABS: 600.0000 mg | ORAL_TABLET | Freq: Four times a day (QID) | ORAL | 0 refills | Status: DC

## 2016-06-02 NOTE — Discharge Summary (Signed)
OB Discharge Summary     Patient Name: Karen Burke DOB: 02/16/1986 MRN: [id number]  Date of admission: 05/31/2016 Delivering MD: Arvilla MarketWALLACE, CATHERINE LAUREN   Date of discharge: 06/02/2016  Admitting diagnosis: 40WKS,LABOR Intrauterine pregnancy: 7763w3d     Secondary diagnosis:  Active Problems:   Active labor  Additional problems: none     Discharge diagnosis: Term Pregnancy Delivered                                                                                                Post partum procedures:none  Augmentation: none  Complications: None  Hospital course:  Onset of Labor With Vaginal Delivery     30 y.o. yo [id number] at 7163w3d was admitted in Active Labor on 05/31/2016. Patient had an uncomplicated labor course as follows:  Membrane Rupture Time/Date: 6:45 PM ,05/31/2016   Intrapartum Procedures: Episiotomy: None [1]                                         Lacerations:  1st degree [2];Perineal [11]  Patient had a delivery of a Viable infant. 05/31/2016  Information for the patient's newborn:  [name], Boy [name] [[id number]][[id number]]  Delivery Method: Vag-Spont    Pateint had an uncomplicated postpartum course.  She is ambulating, tolerating a regular diet, passing flatus, and urinating well. Patient is discharged home in stable condition on 06/02/16.    Physical exam Vitals:   05/31/16 2150 06/01/16 0150 06/01/16 1700 06/02/16 0625  BP: 128/72 105/62 122/75 116/72  Pulse: 89 91 (!) 101 90  Resp: 20 18 18 18   Temp:  98.5 F (36.9 C) 98.3 F (36.8 C) 98.5 F (36.9 C)  TempSrc:  Oral Oral Oral  SpO2:      Weight:      Height:       General: alert, cooperative and no distress Lochia: appropriate Uterine Fundus: firm DVT Evaluation: No evidence of DVT seen on physical exam. Negative Homan's sign. Labs: Lab Results  Component Value Date   WBC 7.7 05/31/2016   HGB 11.1 (L) 05/31/2016   HCT 33.4 (L) 05/31/2016   MCV 79.7 05/31/2016   PLT 171 05/31/2016   CMP Latest  Ref Rng & Units 03/11/2016  Glucose 65 - 99 mg/dL 657(Q105(H)  BUN 6 - 20 mg/dL <4(O<5(L)  Creatinine 9.620.44 - 1.00 mg/dL 9.520.45  Sodium 841135 - 324145 mmol/L 131(L)  Potassium 3.5 - 5.1 mmol/L 3.6  Chloride 101 - 111 mmol/L 102  CO2 22 - 32 mmol/L 20(L)  Calcium 8.9 - 10.3 mg/dL 4.0(N8.5(L)  Total Protein 6.5 - 8.1 g/dL 6.4(L)  Total Bilirubin 0.3 - 1.2 mg/dL 0.6  Alkaline Phos 38 - 126 U/L 94  AST 15 - 41 U/L 18  ALT 14 - 54 U/L 12(L)    Discharge instruction: per After Visit Summary and "Baby and Me Booklet".  After visit meds:    Medication List    TAKE these medications   ibuprofen 600 MG tablet Commonly known as:  ADVIL,MOTRIN Take  1 tablet (600 mg total) by mouth every 6 (six) hours.   prenatal multivitamin Tabs tablet Take 1 tablet by mouth daily at 12 noon.   senna-docusate 8.6-50 MG tablet Commonly known as:  Senokot-S Take 2 tablets by mouth daily.       Diet: routine diet  Activity: Advance as tolerated. Pelvic rest for 6 weeks.   Outpatient follow up:6 weeks Follow up Appt:Future Appointments Date Time Provider Department Center  07/13/2016 8:40 AM Marlis Edelson, CNM [facility]   Follow up Visit: Follow-up Information    Center for [facility]. Schedule an appointment as soon as possible for a visit in 6 week(s).   Specialty:  Obstetrics and Gynecology Why:  postpartum visit Contact information: [address] [place] [address] [phone]         Postpartum contraception: Undecided and Discussed options. Patient is open to anything other than OCPs  Newborn Data: Live born female  Birth Weight: 7 lb 12.3 oz (3525 g) APGAR: 8, 9  Baby Feeding: Breast Disposition:home with mother   06/02/2016 Leland Her, DO PGY-1  OB FELLOW DISCHARGE ATTESTATION  I have seen and examined this patient and agree with above documentation in the resident's note.   Ernestina Penna, MD 8:57 AM

## 2016-06-02 NOTE — Discharge Instructions (Signed)

## 2016-06-02 NOTE — Plan of Care (Signed)
Problem: Health Behavior/Discharge Planning: Goal: Ability to manage health-related needs will improve Outcome: Completed/Met Date Met: 06/02/16 Discharge teaching and paperwork discussed with patient.  FOB translated per pt request.  Pt verbalized understanding of reasons to call the doctor following discharge.  Pt and FOB have no questions

## 2016-07-01 ENCOUNTER — Encounter: Payer: BLUE CROSS/BLUE SHIELD | Admitting: Family

## 2016-07-13 ENCOUNTER — Ambulatory Visit (INDEPENDENT_AMBULATORY_CARE_PROVIDER_SITE_OTHER): Payer: BLUE CROSS/BLUE SHIELD | Admitting: Family

## 2016-07-13 ENCOUNTER — Encounter: Payer: Self-pay | Admitting: Family

## 2016-07-13 VITALS — BP 132/87 | HR 74 | Wt 183.0 lb

## 2016-07-13 DIAGNOSIS — Z1151 Encounter for screening for human papillomavirus (HPV): Secondary | ICD-10-CM | POA: Diagnosis not present

## 2016-07-13 DIAGNOSIS — Z30011 Encounter for initial prescription of contraceptive pills: Secondary | ICD-10-CM

## 2016-07-13 DIAGNOSIS — Z124 Encounter for screening for malignant neoplasm of cervix: Secondary | ICD-10-CM

## 2016-07-13 DIAGNOSIS — Z3009 Encounter for other general counseling and advice on contraception: Secondary | ICD-10-CM

## 2016-07-13 MED ORDER — NORGESTIM-ETH ESTRAD TRIPHASIC 0.18/0.215/0.25 MG-25 MCG PO TABS: 1.0000 | ORAL_TABLET | Freq: Every day | ORAL | 11 refills | Status: DC

## 2016-07-13 NOTE — Progress Notes (Signed)
Subjective:     Melodie BouillonMirlene Atlas is a 30 y.o. female who presents for a postpartum visit. She is 6 weeks postpartum following a spontaneous vaginal delivery. I have fully reviewed the prenatal and intrapartum course. The delivery was at 39 gestational weeks. Outcome: spontaneous vaginal delivery. Anesthesia: none. Postpartum course has been unremarkable. Baby's course has been unremarkable. Baby is feeding by both breast and bottle - Enfamil Lipil.  Bled approximately 2 days postpartum.  Bleeding returned yesterday.   Bleeding red. Bowel function is normal. Bladder function is normal. Patient is not sexually active. Contraception method is none. Desires OCPs.  Postpartum depression screening: negative.  The following portions of the patient's history were reviewed and updated as appropriate: allergies, current medications, past family history, past medical history, past social history, past surgical history and problem list.  Review of Systems Pertinent items are noted in HPI.   Objective:    BP 132/87   Pulse 74   Wt 183 lb (83 kg)   LMP 07/09/2016 (Exact Date)   Breastfeeding? Yes Comment: breast and formula  BMI 31.41 kg/m         Assessment:     Normal postpartum exam. Pap smear done at today's visit.   Plan:    1. Contraception: OCP (estrogen/progesterone); explained milk production may decrease.  Recommended back-up method x 2 wks 2. RX Sprintec with refill x 11 3. Follow up as needed.    Eino FarberWalidah Kennith GainN Karim, CNM

## 2016-07-15 LAB — CYTOLOGY - PAP

## 2016-07-29 ENCOUNTER — Telehealth: Payer: Self-pay | Admitting: General Practice

## 2016-07-29 NOTE — Telephone Encounter (Signed)
Called patient and her husband answered stating they signed the form for him to receive information without an interpreter. Informed him of pap results & recommendation of pap in one year. He verbalized understanding and had no questions

## 2018-08-25 ENCOUNTER — Encounter (HOSPITAL_COMMUNITY): Payer: Self-pay | Admitting: *Deleted

## 2018-08-25 ENCOUNTER — Inpatient Hospital Stay (HOSPITAL_COMMUNITY)
Admission: AD | Admit: 2018-08-25 | Discharge: 2018-08-25 | Disposition: A | Discharge status: Home / Self Care | Payer: BLUE CROSS/BLUE SHIELD | Source: Ambulatory Visit | Attending: Obstetrics & Gynecology | Admitting: Obstetrics & Gynecology

## 2018-08-25 ENCOUNTER — Emergency Department: Admission: EM | Admit: 2018-08-25 | Discharge: 2018-08-25 | Discharge status: Absent without leave | Payer: Self-pay

## 2018-08-25 ENCOUNTER — Other Ambulatory Visit: Payer: Self-pay

## 2018-08-25 ENCOUNTER — Inpatient Hospital Stay (HOSPITAL_COMMUNITY): Payer: BLUE CROSS/BLUE SHIELD

## 2018-08-25 DIAGNOSIS — Z793 Long term (current) use of hormonal contraceptives: Secondary | ICD-10-CM | POA: Diagnosis not present

## 2018-08-25 DIAGNOSIS — N939 Abnormal uterine and vaginal bleeding, unspecified: Secondary | ICD-10-CM | POA: Diagnosis present

## 2018-08-25 DIAGNOSIS — O034 Incomplete spontaneous abortion without complication: Secondary | ICD-10-CM | POA: Insufficient documentation

## 2018-08-25 DIAGNOSIS — O208 Other hemorrhage in early pregnancy: Secondary | ICD-10-CM

## 2018-08-25 DIAGNOSIS — R109 Unspecified abdominal pain: Secondary | ICD-10-CM | POA: Insufficient documentation

## 2018-08-25 DIAGNOSIS — O26891 Other specified pregnancy related conditions, first trimester: Secondary | ICD-10-CM | POA: Insufficient documentation

## 2018-08-25 DIAGNOSIS — O209 Hemorrhage in early pregnancy, unspecified: Secondary | ICD-10-CM

## 2018-08-25 DIAGNOSIS — Z3A09 9 weeks gestation of pregnancy: Secondary | ICD-10-CM | POA: Diagnosis not present

## 2018-08-25 DIAGNOSIS — O26899 Other specified pregnancy related conditions, unspecified trimester: Secondary | ICD-10-CM

## 2018-08-25 LAB — URINALYSIS, ROUTINE W REFLEX MICROSCOPIC
BACTERIA UA: NONE SEEN
BILIRUBIN URINE: NEGATIVE
Glucose, UA: NEGATIVE mg/dL
KETONES UR: NEGATIVE mg/dL
LEUKOCYTES UA: NEGATIVE
NITRITE: NEGATIVE
PH: 8 (ref 5.0–8.0)
Protein, ur: NEGATIVE mg/dL
SPECIFIC GRAVITY, URINE: 1.018 (ref 1.005–1.030)

## 2018-08-25 LAB — POCT PREGNANCY, URINE: PREG TEST UR: POSITIVE — AB

## 2018-08-25 LAB — CBC
HEMATOCRIT: 32.7 % — AB (ref 36.0–46.0)
HEMOGLOBIN: 10.4 g/dL — AB (ref 12.0–15.0)
MCH: 26 pg (ref 26.0–34.0)
MCHC: 31.8 g/dL (ref 30.0–36.0)
MCV: 81.8 fL (ref 80.0–100.0)
Platelets: 294 10*3/uL (ref 150–400)
RBC: 4 MIL/uL (ref 3.87–5.11)
RDW: 15.6 % — AB (ref 11.5–15.5)
WBC: 6.7 10*3/uL (ref 4.0–10.5)
nRBC: 0 % (ref 0.0–0.2)

## 2018-08-25 LAB — WET PREP, GENITAL
Clue Cells Wet Prep HPF POC: NONE SEEN
SPERM: NONE SEEN
Trich, Wet Prep: NONE SEEN
WBC WET PREP: NONE SEEN
Yeast Wet Prep HPF POC: NONE SEEN

## 2018-08-25 LAB — HCG, QUANTITATIVE, PREGNANCY: hCG, Beta Chain, Quant, S: 628 m[IU]/mL — ABNORMAL HIGH (ref <5)

## 2018-08-25 MED ORDER — PROMETHAZINE HCL 25 MG PO TABS: 12.5000 mg | ORAL_TABLET | Freq: Four times a day (QID) | ORAL | 0 refills | Status: DC | PRN

## 2018-08-25 MED ORDER — IBUPROFEN 800 MG PO TABS: 800.0000 mg | ORAL_TABLET | Freq: Three times a day (TID) | ORAL | 0 refills | Status: DC

## 2018-08-25 MED ORDER — MISOPROSTOL 200 MCG PO TABS: 800.0000 ug | ORAL_TABLET | Freq: Once | ORAL | 0 refills | Status: DC

## 2018-08-25 MED ORDER — OXYCODONE-ACETAMINOPHEN 5-325 MG PO TABS: 1.0000 | ORAL_TABLET | ORAL | 0 refills | Status: AC | PRN

## 2018-08-25 NOTE — Discharge Instructions (Signed)
Cytotec for Pregnancy Failure FACTS YOU SHOULD KNOW  WHAT IS AN EARLY PREGNANCY FAILURE? Once the egg is fertilized with the sperm and begins to develop, it attaches to the lining of the uterus. This early pregnancy tissue may not develop into an embryo (the beginning stage of a baby). Sometimes an embryo does develop but does not continue to grow. These problems can be seen on ultrasound.   MANAGEMNT OF EARLY PREGNANCY FAILURE: About 4 out of 100 (0.25%) women will have a pregnancy loss in her lifetime.  One in five pregnancies is found to be an early pregnancy failure.  There are 3 ways to care for an early pregnancy failure:   (1) Surgery, (2) Medicine, (3) Waiting for you to pass the pregnancy on your own. The decision as to how to proceed after being diagnosed with and early pregnancy failure is an individual one.  The decision can be made only after appropriate counseling.  You need to weigh the pros and cons of the 3 choices. Then you can make the choice that works for you. SURGERY (D&E) . Procedure over in 1 day . Requires being put to sleep . Bleeding may be light . Possible problems during surgery, including injury to womb(uterus) . Care provider has more control Medicine (CYTOTEC) . The complete procedure may take days to weeks . No Surgery . Bleeding may be heavy at times . There may be drug side effects . Patient has more control Waiting . You may choose to wait, in which case your own body may complete the passing of the abnormal early pregnancy on its own in about 2-4 weeks . Your bleeding may be heavy at times . There is a small possibility that you may need surgery if the bleeding is too much or not all of the pregnancy has passed. CYTOTEC MANAGEMENT Prostaglandins (cytotec) are the most widely used drug for this purpose. They cause the uterus to cramp and contract. You will place the medicine yourself inside your vagina in the privacy of your home. Empting of the uterus  should occur within 3 days but the process may continue for several weeks. The bleeding may seem heavy at times. POSSIBLE SIDE EFFECTS FROM CYTOTEC . Nausea   Vomiting . Diarrhea Fever . Chills  Hot Flashes Side effects  from the process of the early pregnancy failure include: . Cramping  Bleeding . Headaches  Dizziness RISKS: This is a low risk procedure. Less than 1 in 100 women has a complication. An incomplete passage of the early pregnancy may occur. Also, Hemorrhage (heavy bleeding) could happen.  Rarely the pregnancy will not be passed completely. Excessively heavy bleeding may occur.  Your doctor may need to perform surgery to empty the uterus (D&E). Afterwards: Everybody will feel differently after the early pregnancy completion. You may have soreness or cramps for a day or two. You may have soreness or cramps for day or two.  You may have light bleeding for up to 2 weeks. You may be as active as you feel like being. If you have any of the following problems you may call Maternity Admissions Unit at [phone]. . If you have pain that does not get better  with pain medication . Bleeding that soaks through 2 thick full-sized sanitary pads in an hour . Cramps that last longer than 2 days . Foul smelling discharge . Fever above 100.4 degrees F Even if you do not have any of these symptoms, you should have a follow-up exam   to make sure you are healing properly. This appointment will be made for you before you leave the hospital. Your next normal period will start again in 4-6 week after the loss. You can get pregnant soon after the loss, so use birth control right away. Finally: Make sure all your questions are answered before during and after any procedure. Follow up with medical care and family planning methods.    Miscarriage A miscarriage is the sudden loss of an unborn baby (fetus) before the 20th week of pregnancy. Most miscarriages happen in the first 3 months of pregnancy.  Sometimes, it happens before a woman even knows she is pregnant. A miscarriage is also called a "spontaneous miscarriage" or "early pregnancy loss." Having a miscarriage can be an emotional experience. Talk with your caregiver about any questions you may have about miscarrying, the grieving process, and your future pregnancy plans. What are the causes?  Problems with the fetal chromosomes that make it impossible for the baby to develop normally. Problems with the baby's genes or chromosomes are most often the result of errors that occur, by chance, as the embryo divides and grows. The problems are not inherited from the parents.  Infection of the cervix or uterus.  Hormone problems.  Problems with the cervix, such as having an incompetent cervix. This is when the tissue in the cervix is not strong enough to hold the pregnancy.  Problems with the uterus, such as an abnormally shaped uterus, uterine fibroids, or congenital abnormalities.  Certain medical conditions.  Smoking, drinking alcohol, or taking illegal drugs.  Trauma. Often, the cause of a miscarriage is unknown. What are the signs or symptoms?  Vaginal bleeding or spotting, with or without cramps or pain.  Pain or cramping in the abdomen or lower back.  Passing fluid, tissue, or blood clots from the vagina. How is this diagnosed? Your caregiver will perform a physical exam. You may also have an ultrasound to confirm the miscarriage. Blood or urine tests may also be ordered. How is this treated?  Sometimes, treatment is not necessary if you naturally pass all the fetal tissue that was in the uterus. If some of the fetus or placenta remains in the body (incomplete miscarriage), tissue left behind may become infected and must be removed. Usually, a dilation and curettage (D and C) procedure is performed. During a D and C procedure, the cervix is widened (dilated) and any remaining fetal or placental tissue is gently removed from  the uterus.  Antibiotic medicines are prescribed if there is an infection. Other medicines may be given to reduce the size of the uterus (contract) if there is a lot of bleeding.  If you have Rh negative blood and your baby was Rh positive, you will need a Rh immunoglobulin shot. This shot will protect any future baby from having Rh blood problems in future pregnancies. Follow these instructions at home:  Your caregiver may order bed rest or may allow you to continue light activity. Resume activity as directed by your caregiver.  Have someone help with home and family responsibilities during this time.  Keep track of the number of sanitary pads you use each day and how soaked (saturated) they are. Write down this information.  Do not use tampons. Do not douche or have sexual intercourse until approved by your caregiver.  Only take over-the-counter or prescription medicines for pain or discomfort as directed by your caregiver.  Do not take aspirin. Aspirin can cause bleeding.  Keep all follow-up  appointments with your caregiver.  If you or your partner have problems with grieving, talk to your caregiver or seek counseling to help cope with the pregnancy loss. Allow enough time to grieve before trying to get pregnant again. Get help right away if:  You have severe cramps or pain in your back or abdomen.  You have a fever.  You pass large blood clots (walnut-sized or larger) ortissue from your vagina. Save any tissue for your caregiver to inspect.  Your bleeding increases.  You have a thick, bad-smelling vaginal discharge.  You become lightheaded, weak, or you faint.  You have chills. This information is not intended to replace advice given to you by your health care provider. Make sure you discuss any questions you have with your health care provider. Document Released: 03/16/2001 Document Revised: 02/26/2016 Document Reviewed: 11/09/2011 Elsevier Interactive Patient Education   2017 [employer].

## 2018-08-25 NOTE — MAU Provider Note (Signed)
History     CSN: [id number]  Arrival date and time: 08/25/18 1615   First Provider Initiated Contact with Patient 08/25/18 1654      Chief Complaint  Patient presents with  . Vaginal Bleeding  . Abdominal Pain  . Back Pain   HPI Karen Burke is a 32 y.o. G3P2002 at 7672w3d who presents with vaginal bleeding and abdominal pain. She states she started having heavy bright red vaginal bleeding with large clots on Monday. On Tuesday, she passed something that looked like tissue and the bleeding got better. She brought a picture of what she passed. She reports intermittent cramping that she rates a 3/10 that ibuprofen helps. She reports an LMP of 9/17 but had a negative pregnancy test at home.   OB History    Gravida  3   Para  2   Term  2   Preterm      AB      Living  2     SAB      TAB      Ectopic      Multiple  0   Live Births  2           Past Medical History:  Diagnosis Date  . Anemia   . ASCUS with positive high risk HPV   . Vaginal Pap smear, abnormal     Past Surgical History:  Procedure Laterality Date  . COLPOSCOPY    . cyst removal arm      Family History  Problem Relation Age of Onset  . Hypertension Mother   . Hypertension Father     Social History   Tobacco Use  . Smoking status: Never Smoker  . Smokeless tobacco: Never Used  Substance Use Topics  . Alcohol use: No  . Drug use: No    Allergies: No Known Allergies  Medications Prior to Admission  Medication Sig Dispense Refill Last Dose  . ibuprofen (ADVIL,MOTRIN) 600 MG tablet Take 1 tablet (600 mg total) by mouth every 6 (six) hours. (Patient not taking: Reported on 07/13/2016) 30 tablet 0 Not Taking  . Norgestimate-Ethinyl Estradiol Triphasic 0.18/0.215/0.25 MG-25 MCG tab Take 1 tablet by mouth daily. 1 Package 11   . Prenatal Vit-Fe Fumarate-FA (PRENATAL MULTIVITAMIN) TABS tablet Take 1 tablet by mouth daily at 12 noon. (Patient not taking: Reported on 07/13/2016) 90  tablet 3 Not Taking    Review of Systems  Constitutional: Negative.  Negative for fatigue and fever.  HENT: Negative.   Respiratory: Negative.  Negative for shortness of breath.   Cardiovascular: Negative.  Negative for chest pain.  Gastrointestinal: Positive for abdominal pain. Negative for constipation, diarrhea, nausea and vomiting.  Genitourinary: Positive for vaginal bleeding. Negative for dysuria.  Neurological: Negative.  Negative for dizziness and headaches.       Physical Exam   Blood pressure 136/86, pulse 91, temperature 98.6 F (37 C), resp. rate 16, weight 85.7 kg, last menstrual period 06/20/2018, currently breastfeeding.  Physical Exam  Nursing note and vitals reviewed. Constitutional: She is oriented to person, place, and time. She appears well-developed and well-nourished. No distress.  HENT:  Head: Normocephalic.  Eyes: Pupils are equal, round, and reactive to light.  Cardiovascular: Normal rate, regular rhythm and normal heart sounds.  Respiratory: Effort normal and breath sounds normal. No respiratory distress.  GI: Soft. Bowel sounds are normal. She exhibits no distension. There is no tenderness.  Genitourinary:  Genitourinary Comments: Small amount of bright red bleeding in vault.  Neurological: She is alert and oriented to person, place, and time.  Skin: Skin is warm and dry.  Psychiatric: She has a normal mood and affect. Her behavior is normal. Judgment and thought content normal.    MAU Course  Procedures Results for orders placed or performed during the hospital encounter of 08/25/18 (from the past 24 hour(s))  Urinalysis, Routine w reflex microscopic     Status: Abnormal   Collection Time: 08/25/18  4:42 PM  Result Value Ref Range   Color, Urine YELLOW YELLOW   APPearance CLEAR CLEAR   Specific Gravity, Urine 1.018 1.005 - 1.030   pH 8.0 5.0 - 8.0   Glucose, UA NEGATIVE NEGATIVE mg/dL   Hgb urine dipstick LARGE (A) NEGATIVE   Bilirubin  Urine NEGATIVE NEGATIVE   Ketones, ur NEGATIVE NEGATIVE mg/dL   Protein, ur NEGATIVE NEGATIVE mg/dL   Nitrite NEGATIVE NEGATIVE   Leukocytes, UA NEGATIVE NEGATIVE   RBC / HPF >50 (H) 0 - 5 RBC/hpf   WBC, UA 0-5 0 - 5 WBC/hpf   Bacteria, UA NONE SEEN NONE SEEN   Squamous Epithelial / LPF 0-5 0 - 5   Mucus PRESENT   Pregnancy, urine POC     Status: Abnormal   Collection Time: 08/25/18  4:47 PM  Result Value Ref Range   Preg Test, Ur POSITIVE (A) NEGATIVE  Wet prep, genital     Status: None   Collection Time: 08/25/18  5:00 PM  Result Value Ref Range   Yeast Wet Prep HPF POC NONE SEEN NONE SEEN   Trich, Wet Prep NONE SEEN NONE SEEN   Clue Cells Wet Prep HPF POC NONE SEEN NONE SEEN   WBC, Wet Prep HPF POC NONE SEEN NONE SEEN   Sperm NONE SEEN   CBC     Status: Abnormal   Collection Time: 08/25/18  5:12 PM  Result Value Ref Range   WBC 6.7 4.0 - 10.5 K/uL   RBC 4.00 3.87 - 5.11 MIL/uL   Hemoglobin 10.4 (L) 12.0 - 15.0 g/dL   HCT 19.1 (L) 47.8 - 29.5 %   MCV 81.8 80.0 - 100.0 fL   MCH 26.0 26.0 - 34.0 pg   MCHC 31.8 30.0 - 36.0 g/dL   RDW 62.1 (H) 30.8 - 65.7 %   Platelets 294 150 - 400 K/uL   nRBC 0.0 0.0 - 0.2 %  hCG, quantitative, pregnancy     Status: Abnormal   Collection Time: 08/25/18  5:12 PM  Result Value Ref Range   hCG, Beta Chain, Quant, S 628 (H) <5 mIU/mL   US Ob Less Than 14 Weeks With Ob Transvaginal  Result Date: 08/25/2018 CLINICAL DATA:  Vaginal bleeding and first-trimester pregnancy. Quantitative hCG is not currently available EXAM: OBSTETRIC <14 WK [place] AND TRANSVAGINAL OB US TECHNIQUE: Both transabdominal and transvaginal ultrasound examinations were performed for complete evaluation of the gestation as well as the maternal uterus, adnexal regions, and pelvic cul-de-sac. Transvaginal technique was performed to assess early pregnancy. COMPARISON:  None. FINDINGS: No intra or extrauterine gestational sac is seen. No adnexal mass or pelvic fluid. Heterogeneous  thickening of the endometrial cavity with mobile contents, likely blood products and gestational products. Color Doppler was not applied to the endometrium. IMPRESSION: 1. Pregnancy of unknown location. Differential considerations include intrauterine gestation too early to be sonographically visualized, spontaneous abortion, or ectopic pregnancy. 2. Heterogeneous endometrial cavity contents that are mobile favors spontaneous abortion with incomplete evacuation. Electronically Signed   By: Marja Kays  Watts M.D.   On: 08/25/2018 18:42   MDM UA, UPT CBC, HCG ABO/Rh- O Pos Wet prep and gc/chlamydia US OB Comp Less 14 weeks with Transvaginal  Consulted with Dr. Despina Hidden regarding results of retained POC- ok to offer patient cytotec. Patient agreeable to cytotec. Lengthy discussion of normal bleeding and pain with patient and husband. Patient verbalized understanding.   Early Intrauterine Pregnancy Failure  _X_  Documented intrauterine pregnancy failure less than or equal to [redacted] weeks gestation  _X_  No serious current illness  _X_  Baseline Hgb greater than or equal to 10g/dl  _X_  Patient has easily accessible transportation to the hospital  _X_  Clear preference  _X_  Practitioner/physician deems patient reliable  _X_  Counseling by practitioner or physician  _X_  Patient education by RN  _X_ Medication dispensed   _X_   Cytotec 800 mcg  _X_   Intravaginally by patient at home         __   Intravaginally by RN in MAU        __   Rectally by patient at home        __   Rectally by RN in MAU  _X_  Ibuprofen 800 mg 1 tablet by mouth every 6 hours as needed #30  _X_  Hydrocodone/acetaminophen 5/325 mg by mouth every 4 to 6 hours as needed  _X_  Phenergan 12.5 mg by mouth every 4 hours as needed for nausea   Assessment and Plan   1. Incomplete miscarriage   2. Abdominal pain affecting pregnancy   3. Vaginal bleeding affecting early pregnancy    -Discharge home  -Rx for cytotec,  percocet, ibuprofen and phenergan sent to patient's pharmacy -Vaginal bleeding and pain precautions discussed -Patient advised to follow-up with [facility] in 1 week for repeat blood work and 2 weeks with a provider, message sent -Patient may return to MAU as needed or if her condition were to change or worsen   Rolm Bookbinder CNM 08/25/2018, 7:33 PM

## 2018-08-25 NOTE — MAU Note (Addendum)
Pt presents to MAU with complaints of missing her cycle for 2 months. States she started having vaginal bleeding and passing large clots on Monday with lower abdominal pain starting on Tuesday. NEG pregnancy test at home. Pt denies pain at presents states she took 200mg  of ibuprofen earlier

## 2018-08-28 LAB — GC/CHLAMYDIA PROBE AMP (~~LOC~~) NOT AT ARMC
Chlamydia: NEGATIVE
Neisseria Gonorrhea: NEGATIVE

## 2018-08-30 ENCOUNTER — Other Ambulatory Visit: Payer: Self-pay | Admitting: General Practice

## 2018-08-30 DIAGNOSIS — O039 Complete or unspecified spontaneous abortion without complication: Secondary | ICD-10-CM

## 2018-09-04 ENCOUNTER — Other Ambulatory Visit: Payer: BLUE CROSS/BLUE SHIELD

## 2018-09-04 DIAGNOSIS — O039 Complete or unspecified spontaneous abortion without complication: Secondary | ICD-10-CM

## 2018-09-05 LAB — BETA HCG QUANT (REF LAB): hCG Quant: 11 m[IU]/mL

## 2018-09-06 ENCOUNTER — Telehealth: Payer: Self-pay

## 2018-09-06 NOTE — Telephone Encounter (Signed)
Spoke to husband about wife's quant levels husband stated that they will come to the after hours clinic that Wednesday or Thursday. Pt had no complaints and agreed. Will cancel appointment.

## 2018-09-11 ENCOUNTER — Ambulatory Visit: Payer: BLUE CROSS/BLUE SHIELD | Admitting: Family Medicine

## 2019-03-06 ENCOUNTER — Other Ambulatory Visit: Payer: Self-pay

## 2019-03-06 ENCOUNTER — Inpatient Hospital Stay (HOSPITAL_COMMUNITY): Payer: BC Managed Care – PPO

## 2019-03-06 ENCOUNTER — Inpatient Hospital Stay (HOSPITAL_COMMUNITY)
Admission: AD | Admit: 2019-03-06 | Discharge: 2019-03-06 | Disposition: A | Discharge status: Home / Self Care | Payer: BC Managed Care – PPO | Attending: Family Medicine | Admitting: Family Medicine

## 2019-03-06 ENCOUNTER — Encounter (HOSPITAL_COMMUNITY): Payer: Self-pay

## 2019-03-06 DIAGNOSIS — O4691 Antepartum hemorrhage, unspecified, first trimester: Secondary | ICD-10-CM | POA: Diagnosis not present

## 2019-03-06 DIAGNOSIS — O26891 Other specified pregnancy related conditions, first trimester: Secondary | ICD-10-CM | POA: Diagnosis not present

## 2019-03-06 DIAGNOSIS — O209 Hemorrhage in early pregnancy, unspecified: Secondary | ICD-10-CM

## 2019-03-06 DIAGNOSIS — R109 Unspecified abdominal pain: Secondary | ICD-10-CM | POA: Insufficient documentation

## 2019-03-06 DIAGNOSIS — Z3A10 10 weeks gestation of pregnancy: Secondary | ICD-10-CM | POA: Diagnosis not present

## 2019-03-06 DIAGNOSIS — O3680X0 Pregnancy with inconclusive fetal viability, not applicable or unspecified: Secondary | ICD-10-CM | POA: Insufficient documentation

## 2019-03-06 LAB — URINALYSIS, ROUTINE W REFLEX MICROSCOPIC
Bilirubin Urine: NEGATIVE
Glucose, UA: NEGATIVE mg/dL
Ketones, ur: NEGATIVE mg/dL
Leukocytes,Ua: NEGATIVE
Nitrite: NEGATIVE
Protein, ur: NEGATIVE mg/dL
RBC / HPF: >50 RBC/hpf — ABNORMAL HIGH (ref 0–5)
Specific Gravity, Urine: 1.014 (ref 1.005–1.030)
pH: 8 (ref 5.0–8.0)

## 2019-03-06 LAB — WET PREP, GENITAL
Clue Cells Wet Prep HPF POC: NONE SEEN
Sperm: NONE SEEN
Trich, Wet Prep: NONE SEEN
Yeast Wet Prep HPF POC: NONE SEEN

## 2019-03-06 LAB — HCG, QUANTITATIVE, PREGNANCY: hCG, Beta Chain, Quant, S: 2523 m[IU]/mL — ABNORMAL HIGH (ref <5)

## 2019-03-06 LAB — CBC
HCT: 34.6 % — ABNORMAL LOW (ref 36.0–46.0)
Hemoglobin: 11.1 g/dL — ABNORMAL LOW (ref 12.0–15.0)
MCH: 25.3 pg — ABNORMAL LOW (ref 26.0–34.0)
MCHC: 32.1 g/dL (ref 30.0–36.0)
MCV: 79 fL — ABNORMAL LOW (ref 80.0–100.0)
Platelets: 342 10*3/uL (ref 150–400)
RBC: 4.38 MIL/uL (ref 3.87–5.11)
RDW: 15.6 % — ABNORMAL HIGH (ref 11.5–15.5)
WBC: 9.3 10*3/uL (ref 4.0–10.5)
nRBC: 0 % (ref 0.0–0.2)

## 2019-03-06 LAB — POCT PREGNANCY, URINE: Preg Test, Ur: POSITIVE — AB

## 2019-03-06 NOTE — MAU Provider Note (Addendum)
History     CSN: [id number]  Arrival date and time: 03/06/19 [id number]   First Provider Initiated Contact with Patient 03/06/19 2033      Chief Complaint  Patient presents with  . Abdominal Pain  . Vaginal Bleeding   HPI Karen Burke 33 y.o. 6157w4d  Comes to MAU with vaginal bleeding for the past 2 days and cramping that is worsening.  Had a pregnancy loss in November and then became pregnant again.  Speak [place] and phone interpreter used for the entire visit and exam.     OB History    Gravida  4   Para  2   Term  2   Preterm      AB  1   Living  2     SAB      TAB      Ectopic      Multiple  0   Live Births  2           Past Medical History:  Diagnosis Date  . Anemia   . ASCUS with positive high risk HPV   . Vaginal Pap smear, abnormal     Past Surgical History:  Procedure Laterality Date  . COLPOSCOPY    . cyst removal arm      Family History  Problem Relation Age of Onset  . Hypertension Mother   . Hypertension Father     Social History   Tobacco Use  . Smoking status: Never Smoker  . Smokeless tobacco: Never Used  Substance Use Topics  . Alcohol use: No  . Drug use: No    Allergies: No Known Allergies  Medications Prior to Admission  Medication Sig Dispense Refill Last Dose  . ibuprofen (ADVIL,MOTRIN) 800 MG tablet Take 1 tablet (800 mg total) by mouth 3 (three) times daily. 30 tablet 0   . misoprostol (CYTOTEC) 200 MCG tablet Place 4 tablets (800 mcg total) vaginally once for 1 dose. 4 tablet 0   . Prenatal Vit-Fe Fumarate-FA (PRENATAL MULTIVITAMIN) TABS tablet Take 1 tablet by mouth daily at 12 noon. (Patient not taking: Reported on 07/13/2016) 90 tablet 3 Not Taking  . promethazine (PHENERGAN) 25 MG tablet Take 0.5 tablets (12.5 mg total) by mouth every 6 (six) hours as needed for nausea or vomiting. 30 tablet 0     Review of Systems  Constitutional: Positive for fever.  Respiratory: Negative for cough.   Gastrointestinal:  Positive for abdominal pain. Negative for nausea and vomiting.  Genitourinary: Positive for vaginal bleeding. Negative for dysuria and vaginal discharge.   Physical Exam   Blood pressure 120/78, pulse 96, temperature 98.5 F (36.9 C), temperature source Oral, resp. rate 18, height 5\' 1"  (1.549 m), weight 88.9 kg, last menstrual period 12/22/2018, SpO2 100 %, unknown if currently breastfeeding.  Physical Exam  Nursing note and vitals reviewed. Constitutional: She is oriented to person, place, and time. She appears well-developed and well-nourished. No distress.  HENT:  Head: Normocephalic.  Eyes: EOM are normal.  Neck: Neck supple.  Cardiovascular: Normal rate.  Respiratory: Effort normal.  GI: Soft. There is no abdominal tenderness. There is no rebound and no guarding.  Unable to hear FHT with doppler  Genitourinary:    Genitourinary Comments: Small amount of red blood noted on her pad Speculum exam: Vagina - Mod amount of dark red blood noted pooling in speculum, no odor Cervix - Active bleeding seen Bimanual exam: Cervix - internal os closed Uterus non tender, Unable to size due  to habitus Adnexa non tender, no masses bilaterally GC/Chlam, wet prep done Chaperone present for exam.    Musculoskeletal: Normal range of motion.  Neurological: She is alert and oriented to person, place, and time.  Skin: Skin is warm and dry.  Psychiatric: She has a normal mood and affect.    MAU Course  Procedures Results for orders placed or performed during the hospital encounter of 03/06/19 (from the past 24 hour(s))  Urinalysis, Routine w reflex microscopic     Status: Abnormal   Collection Time: 03/06/19  8:06 PM  Result Value Ref Range   Color, Urine STRAW (A) YELLOW   APPearance CLOUDY (A) CLEAR   Specific Gravity, Urine 1.014 1.005 - 1.030   pH 8.0 5.0 - 8.0   Glucose, UA NEGATIVE NEGATIVE mg/dL   Hgb urine dipstick LARGE (A) NEGATIVE   Bilirubin Urine NEGATIVE NEGATIVE    Ketones, ur NEGATIVE NEGATIVE mg/dL   Protein, ur NEGATIVE NEGATIVE mg/dL   Nitrite NEGATIVE NEGATIVE   Leukocytes,Ua NEGATIVE NEGATIVE   RBC / HPF >50 (H) 0 - 5 RBC/hpf   WBC, UA 0-5 0 - 5 WBC/hpf   Bacteria, UA RARE (A) NONE SEEN   Mucus PRESENT    Amorphous Crystal PRESENT   Pregnancy, urine POC     Status: Abnormal   Collection Time: 03/06/19  8:11 PM  Result Value Ref Range   Preg Test, Ur POSITIVE (A) NEGATIVE  Wet prep, genital     Status: Abnormal   Collection Time: 03/06/19  8:49 PM  Result Value Ref Range   Yeast Wet Prep HPF POC NONE SEEN NONE SEEN   Trich, Wet Prep NONE SEEN NONE SEEN   Clue Cells Wet Prep HPF POC NONE SEEN NONE SEEN   WBC, Wet Prep HPF POC FEW (A) NONE SEEN   Sperm NONE SEEN   CBC     Status: Abnormal   Collection Time: 03/06/19  8:54 PM  Result Value Ref Range   WBC 9.3 4.0 - 10.5 K/uL   RBC 4.38 3.87 - 5.11 MIL/uL   Hemoglobin 11.1 (L) 12.0 - 15.0 g/dL   HCT 92.4 (L) 46.2 - 86.3 %   MCV 79.0 (L) 80.0 - 100.0 fL   MCH 25.3 (L) 26.0 - 34.0 pg   MCHC 32.1 30.0 - 36.0 g/dL   RDW 81.7 (H) 71.1 - 65.7 %   Platelets 342 150 - 400 K/uL   nRBC 0.0 0.0 - 0.2 %  hCG, quantitative, pregnancy     Status: Abnormal   Collection Time: 03/06/19  8:54 PM  Result Value Ref Range   hCG, Beta Chain, Quant, S 2,523 (H) <5 mIU/mL   CLINICAL DATA:  Abdominal pain and vaginal bleeding at 10 weeks and 4 days gestation by last menstrual period.  EXAM: OBSTETRIC <14 WK [place] AND TRANSVAGINAL OB US  TECHNIQUE: Both transabdominal and transvaginal ultrasound examinations were performed for complete evaluation of the gestation as well as the maternal uterus, adnexal regions, and pelvic cul-de-sac. Transvaginal technique was performed to assess early pregnancy.  COMPARISON:  08/25/2018.  FINDINGS: Intrauterine gestational sac: Not visualized  Yolk sac:  Not visualized  Embryo:  Not visualized  Maternal uterus/adnexae: The endometrium is thickened  and heterogeneous. Normal appearing ovaries. No adnexal mass is seen. No free peritoneal fluid.  IMPRESSION: 1. No intrauterine or extrauterine gestation seen. 2. Mildly thickened and heterogeneous endometrium, suspicious for recent spontaneous abortion. 3. The possibility of a nonvisualized ectopic pregnancy can be excluded with  serial quantitative beta HCG determinations.    MDM Blood type is O positive Discussed results with client with phone interpreter.  LIkely this is a miscarriage but could be an ectopic pregnancy and lab results on Friday at 11 could help [place] determine what is needed next.  Advised that if quant is increasing, she will need additional medication.  We are expecting the quant to be decreasing. Until we know this is a pregnancy in the uterus and not in your tube: Do not put anything in the vagina - no tampons, no douching. Do not have sexual intercourse. Do not lift more than 5 pounds. Return if you develop sudden, worse abdominal pain. Return if you have heavy vaginal bleeding. Client informed that a pregnancy outside the uterus could be a dangerous situation and if she has sudden severe pain, she should return immediately to MAU.  Assessment and Plan  Pregnancy of unknown anatomic location Possible miscarriage  Plan Needs follow up quant on Friday morning at 11 am in MAU.  Due to language barrier, will have lab work done in MAU and then schedule follow up in the [place] clinic as quants will need to be followed to zero. No sex, no tampons, no douching. Expect heavier bleeding than a menstrual period. Return here sooner with severe abdominal pain. May take Tylenol by the package directions for pain.  Currie Paris 03/06/2019, 8:43 PM

## 2019-03-06 NOTE — Discharge Instructions (Signed)
No sex, no tampons, no douching. Expect heavier bleeding than a menstrual period. Return here sooner with severe abdominal pain. May take Tylenol by the package directions for pain. Return on Friday morning at 11 am for repeat lab work

## 2019-03-06 NOTE — MAU Note (Signed)
Pt here with c/o vaginal bleeding and abdominal pain. Hx vaginal deliveries x2. Hx abortion x1 in November of last year. LMP 12/22/18.

## 2019-03-07 LAB — RPR: RPR Ser Ql: NONREACTIVE

## 2019-03-07 LAB — HIV ANTIBODY (ROUTINE TESTING W REFLEX): HIV Screen 4th Generation wRfx: NONREACTIVE

## 2019-03-07 LAB — GC/CHLAMYDIA PROBE AMP (~~LOC~~) NOT AT ARMC
Chlamydia: NEGATIVE
Neisseria Gonorrhea: NEGATIVE

## 2019-03-09 ENCOUNTER — Other Ambulatory Visit: Payer: Self-pay

## 2019-03-09 ENCOUNTER — Inpatient Hospital Stay (HOSPITAL_COMMUNITY)
Admission: AD | Admit: 2019-03-09 | Discharge: 2019-03-09 | Disposition: A | Discharge status: Home / Self Care | Payer: BC Managed Care – PPO | Attending: Obstetrics and Gynecology | Admitting: Obstetrics and Gynecology

## 2019-03-09 DIAGNOSIS — R109 Unspecified abdominal pain: Secondary | ICD-10-CM | POA: Diagnosis not present

## 2019-03-09 DIAGNOSIS — Z3A10 10 weeks gestation of pregnancy: Secondary | ICD-10-CM | POA: Diagnosis not present

## 2019-03-09 DIAGNOSIS — N939 Abnormal uterine and vaginal bleeding, unspecified: Secondary | ICD-10-CM | POA: Diagnosis present

## 2019-03-09 DIAGNOSIS — O4691 Antepartum hemorrhage, unspecified, first trimester: Secondary | ICD-10-CM

## 2019-03-09 DIAGNOSIS — O26891 Other specified pregnancy related conditions, first trimester: Secondary | ICD-10-CM

## 2019-03-09 LAB — HCG, QUANTITATIVE, PREGNANCY: hCG, Beta Chain, Quant, S: 522 m[IU]/mL — ABNORMAL HIGH (ref <5)

## 2019-03-09 NOTE — MAU Note (Signed)
Pt here for f/u labwork.  Is having a lot of pain today. Cramping in lower abd and low back, comes and goes,  Stays longer then when it goes.  More pain when she bleeds, and passes clots.Marland Kitchen

## 2019-03-09 NOTE — MAU Provider Note (Signed)
First Provider Initiated Contact with Patient 03/09/19 1232     S Ms. Karen Burke is a 33 y.o. [id number] non-pregnant female who presents to MAU today for follow-up quant .  She was seen in MAU on 03/06/2019 for vaginal bleeding and abdominal pain in pregnancy of unknown location. Patient reports ongoing vaginal spotting and intermittent abdominal cramping. She denies pain upon CNM introduction today.  O BP (!) 136/93 (BP Location: Right Arm)   Pulse 91   Temp 98.7 F (37.1 C) (Oral)   Resp 18   LMP 12/22/2018 (Exact Date)   SpO2 100%      Physical Exam  Nursing note and vitals reviewed. Constitutional: She is oriented to person, place, and time. She appears well-developed and well-nourished.  Cardiovascular: Normal rate.  Respiratory: Effort normal. No respiratory distress.  Neurological: She is alert and oriented to person, place, and time.  Skin: Skin is warm and dry.  Psychiatric: She has a normal mood and affect. Her behavior is normal. Thought content normal.    A Medical screening exam complete Decrease in Quant 2523 on 06/02 now 522  P Discharge from MAU in stable condition with ectopic/bleeding precautions  F/U: Patient is unable to be seen in clinic until Wednesday 06/10 due to her schedule Appt made for stat quant hCG 06/10 at 11am at [facility] Patient given phone number and directions to clinic Patient may return to MAU as needed for pregnancy related complaints  Calvert Cantor, [place] 03/09/2019 1:04 PM

## 2019-03-12 ENCOUNTER — Other Ambulatory Visit: Payer: BC Managed Care – PPO

## 2019-03-13 ENCOUNTER — Telehealth: Payer: Self-pay | Admitting: Family Medicine

## 2019-03-13 NOTE — Telephone Encounter (Signed)
Spoke with patient with [place] interpreter ID# [phone] to confirm her appointment with [place] on 6/10 @ 31. Patient was instructed to wear a face mask for the entire visit and no visitors are allowed with her. The patient was screened for covid symptoms and denied any symptoms. Patient stated that the time of her appointment does not work for her because her ride has to work and wanted to be rescheduled. Patient was rescheduled for 6/15 @ 2. Patient verbalized understanding that the same instructions apply for this appointment.

## 2019-03-14 ENCOUNTER — Other Ambulatory Visit: Payer: BC Managed Care – PPO

## 2019-03-19 ENCOUNTER — Ambulatory Visit: Payer: BC Managed Care – PPO

## 2019-08-24 DIAGNOSIS — G4733 Obstructive sleep apnea (adult) (pediatric): Secondary | ICD-10-CM | POA: Insufficient documentation

## 2019-08-24 DIAGNOSIS — E669 Obesity, unspecified: Secondary | ICD-10-CM | POA: Insufficient documentation

## 2019-09-26 ENCOUNTER — Ambulatory Visit: Payer: BC Managed Care – PPO | Admitting: Obstetrics & Gynecology

## 2019-09-26 DIAGNOSIS — Z01419 Encounter for gynecological examination (general) (routine) without abnormal findings: Secondary | ICD-10-CM

## 2019-12-24 ENCOUNTER — Other Ambulatory Visit: Payer: Self-pay

## 2019-12-24 ENCOUNTER — Ambulatory Visit (INDEPENDENT_AMBULATORY_CARE_PROVIDER_SITE_OTHER): Payer: BC Managed Care – PPO | Admitting: Obstetrics & Gynecology

## 2019-12-24 ENCOUNTER — Encounter: Payer: Self-pay | Admitting: Obstetrics & Gynecology

## 2019-12-24 VITALS — BP 118/75 | HR 79 | Ht 60.0 in | Wt 192.1 lb

## 2019-12-24 DIAGNOSIS — Z01419 Encounter for gynecological examination (general) (routine) without abnormal findings: Secondary | ICD-10-CM

## 2019-12-24 DIAGNOSIS — L02225 Furuncle of perineum: Secondary | ICD-10-CM

## 2019-12-24 DIAGNOSIS — N393 Stress incontinence (female) (male): Secondary | ICD-10-CM

## 2019-12-24 DIAGNOSIS — N898 Other specified noninflammatory disorders of vagina: Secondary | ICD-10-CM | POA: Diagnosis not present

## 2019-12-24 DIAGNOSIS — Z1151 Encounter for screening for human papillomavirus (HPV): Secondary | ICD-10-CM

## 2019-12-24 DIAGNOSIS — L02429 Furuncle of limb, unspecified: Secondary | ICD-10-CM

## 2019-12-24 DIAGNOSIS — Z124 Encounter for screening for malignant neoplasm of cervix: Secondary | ICD-10-CM

## 2019-12-24 NOTE — Progress Notes (Signed)
Subjective:     Karen Burke is a 34 y.o. female here for a routine exam.  Current complaints: Pt reports that she has been having small abscesses under her arms on her mons. She also reports that she has been having urinary freq since having her children and she reports that her sx have been getting worse and now it is becoming more difficult to make it to the bathroom. She denies caffeine use.       Gynecologic History Patient's last menstrual period was 11/19/2019. Last Pap: 07/13/2016. Results were: normal Last mammogram: n/a  Obstetric History OB History  Gravida Para Term Preterm AB Living  4 2 2   1 2   SAB TAB Ectopic Multiple Live Births        0 2    # Outcome Date GA Lbr Len/2nd Weight Sex Delivery Anes PTL Lv  4 Gravida           3 AB 2019          2 Term 05/31/16 [redacted]w[redacted]d 20:46 / 00:45 7 lb 12.3 oz (3.525 kg) M Vag-Spont None  LIV  1 Term 06/17/15 [redacted]w[redacted]d 24:51 / 02:35 6 lb 7.4 oz (2.93 kg) F Vag-Spont EPI  LIV     The following portions of the patient's history were reviewed and updated as appropriate: allergies, current medications, past family history, past medical history, past social history, past surgical history and problem list.  Review of Systems Pertinent items are noted in HPI.    Objective:  BP 118/75   Pulse 79   Ht 5' (1.524 m)   Wt 192 lb 1.3 oz (87.1 kg)   LMP 11/19/2019   BMI 37.51 kg/m   General Appearance:    Alert, cooperative, no distress, appears stated age  Head:    Normocephalic, without obvious abnormality, atraumatic  Eyes:    conjunctiva/corneas clear, EOM's intact, both eyes  Ears:    Normal external ear canals, both ears  Nose:   Nares normal, septum midline, mucosa normal, no drainage    or sinus tenderness  Throat:   Lips, mucosa, and tongue normal; teeth and gums normal  Neck:   Supple, symmetrical, trachea midline, no adenopathy;    thyroid:  no enlargement/tenderness/nodules  Back:     Symmetric, no curvature, ROM normal, no CVA  tenderness  Lungs:     respirations unlabored  Chest Wall:    No tenderness or deformity   Heart:    Regular rate and rhythm  Breast Exam:    No tenderness, masses, or nipple abnormality  Abdomen:     Soft, non-tender, bowel sounds active all four quadrants,    no masses, no organomegaly  Genitalia:    Normal female without lesion, discharge or tenderness; some scarring of the mons and the axilla no active lesions. q-tip angle 60 degrees.     Extremities:   Extremities normal, atraumatic, no cyanosis or edema  Pulses:   2+ and symmetric all extremities  Skin:   Skin color, texture, turgor normal, no rashes or lesions    Assessment:    Healthy female exam.   OAB/SUI- I have reviewed with pt the potential management options. Pt would like to try conservative measures.   freq furuncles in axilla and mons pubis.  Vaginal itching    Plan:   Referral to PT Handout for Kegels.  F/u PAP with hrHPV F/u in 3 months or sooner prn  Derryck Shahan L. Harraway-Smith, M.D., 11/21/2019

## 2019-12-24 NOTE — Progress Notes (Signed)
[facility] interpreter Pres[place]e [phone]. Frequent urination and vaginal itching x 2 weeks.

## 2019-12-25 LAB — CYTOLOGY - PAP
Comment: NEGATIVE
Diagnosis: NEGATIVE
High risk HPV: NEGATIVE

## 2019-12-25 LAB — CERVICOVAGINAL ANCILLARY ONLY
Candida Glabrata: NEGATIVE
Candida Vaginitis: NEGATIVE
Comment: NEGATIVE
Comment: NEGATIVE

## 2019-12-27 NOTE — Patient Instructions (Signed)

## 2020-01-16 ENCOUNTER — Ambulatory Visit: Payer: BC Managed Care – PPO | Admitting: Physical Therapy

## 2020-02-13 ENCOUNTER — Encounter: Payer: Self-pay | Admitting: Physical Therapy

## 2020-02-13 ENCOUNTER — Other Ambulatory Visit: Payer: Self-pay

## 2020-02-13 ENCOUNTER — Ambulatory Visit: Payer: BC Managed Care – PPO | Attending: Obstetrics & Gynecology | Admitting: Physical Therapy

## 2020-02-13 DIAGNOSIS — R278 Other lack of coordination: Secondary | ICD-10-CM | POA: Diagnosis present

## 2020-02-13 DIAGNOSIS — R252 Cramp and spasm: Secondary | ICD-10-CM | POA: Diagnosis present

## 2020-02-13 DIAGNOSIS — M6281 Muscle weakness (generalized): Secondary | ICD-10-CM

## 2020-02-13 NOTE — Therapy (Addendum)
[facility] [address] [place], [place], [address] Phone: [phone]   Fax:  [phone]  Physical Therapy Evaluation  Patient Details  Name: Karen Burke MRN: [id number] Date of Birth: 1986-01-22 No data recorded  Encounter Date: 02/13/2020  PT End of Session - 02/13/20 1556    Visit Number  1    Date for PT Re-Evaluation  06/04/20    PT Start Time  1544   arrived late   PT Stop Time  1614    PT Time Calculation (min)  30 min    Activity Tolerance  Patient tolerated treatment well    Behavior During Therapy  [facility] for tasks assessed/performed       Past Medical History:  Diagnosis Date  . Anemia   . ASCUS with positive high risk HPV   . Pyelonephritis affecting pregnancy in second trimester 03/11/2016  . Vaginal Pap smear, abnormal     Past Surgical History:  Procedure Laterality Date  . COLPOSCOPY    . cyst removal arm      There were no vitals filed for this visit.   Subjective Assessment - 02/13/20 1548    Subjective  Pt has her husband present to interpret because both report she has had trouble understanding when going to the GYN.  States ever since giving birth to her children (ages 12 and 54 currently) has had frequency and unable to empty the bladder.  Pt had episiotomy with both deliveries and stitches.  Pt denies any pain.  States extremem urgency.  Urinates about 1/4 cup every time.    Currently in Pain?  No/denies                      Objective measurements completed on examination: See above findings.                PT Short Term Goals - 02/14/20 0826      PT SHORT TERM GOAL #1   Title  Pt will be ind with initial HEP    Time  4    Period  Weeks    Status  New    Target Date  03/12/20      PT SHORT TERM GOAL #2   Title  Pt will be ind with urge to void and double void techniques for improved bladder control    Time  4    Period  Weeks    Status  New     Target Date  03/12/20        PT Long Term Goals - 02/14/20 0822      PT LONG TERM GOAL #1   Title  Pt will report at least 60% less urgency    Time  16    Period  Weeks    Status  New    Target Date  06/04/20      PT LONG TERM GOAL #2   Title  Pt will be able to get to the bathroom and sit down before the stream starts    Time  16    Period  Weeks    Status  New    Target Date  06/04/20      PT LONG TERM GOAL #3   Title  Pt will report she feels a complete emptying of her bladder when she voids    Baseline  about 1/4 cup at a time    Time  16    Period  Weeks    Status  New    Target Date  06/04/20      PT LONG TERM GOAL #4   Title  Pt will demonstrate 3/5 MMT pelvic floor and ability to perform excursion for improved muscle function and stability of the bladder    Time  16    Period  Weeks    Status  New    Target Date  06/04/20      PT LONG TERM GOAL #5   Title  Pt will be able to sustain a pelvic floor contraction for at least 15 seconds for improved stability during functional movements such as lifting    Time  16    Period  Weeks    Status  New    Target Date  06/04/20             Plan - 02/14/20 0830    Clinical Impression Statement  Pt arrives at clinic today due to urinary frequency and dysuria.  Pt arrives with husband today due to reporting difficutly with the interpreters being able to translate to her in a way that she can understand.  Pt is arrived late today due to extra time needed for interpretation so today's evalution includes assessment only.  Gait, posture and abdominal strength as well as pelvic floor were assessed today.  Pt demonstrates increased lumbar lordosis and excess abdominal adipose is present limiting her AROM lumbar flexion and ribcage movements.  She has diastasis of rectus abdominus of 2 fingers at the umbilicus.  Pt has tenting of upper abdomen when performing strength test.  Pt has 4/5 MMT of core strength.  Pt has 2/5 MMT  pelvic floor with high tone palpated anteriorly and low tone posteriorly.  She had episiotomy x 2 during her labor and delivery of her children.  Pt can sustain pelvic floor contraction for 5 seconds.  She will benefit from skilled PT to address these impairments as well as others found in upcoming session in order to immprove her function and qulaity of life.    Personal Factors and Comorbidities  Time since onset of injury/illness/exacerbation;Comorbidity 2    Comorbidities  2 episiotomies; extra time needed for interpretation    Examination-Activity Limitations  Continence;Toileting    Stability/Clinical Decision Making  Evolving/Moderate complexity    Clinical Decision Making  Moderate    Rehab Potential  Excellent    PT Frequency  2x / week   will reduce frequency as able; may only be attending 1x/week   PT Duration  --   16 weeks   PT Treatment/Interventions  ADLs/Self Care Home Management;Biofeedback;Cryotherapy;Electrical Stimulation;Moist Heat;Neuromuscular re-education;Therapeutic exercise;Therapeutic activities;Patient/family education;Manual techniques;Dry needling;Passive range of motion;Taping    PT Next Visit Plan  double void, toileting, urge to void, breathing and bulging; internal STM to release attachments at pubic bone    PT Home Exercise Plan  issue next - urge to void and double void, breathing and stretch    Consulted and Agree with Plan of Care  Patient       Patient will benefit from skilled therapeutic intervention in order to improve the following deficits and impairments:  Abnormal gait, Postural dysfunction, Decreased strength, Decreased coordination, Obesity, Increased muscle spasms, Decreased endurance  Visit Diagnosis: Muscle weakness (generalized)  Other lack of coordination  Cramp and spasm     Problem List Patient Active Problem List   Diagnosis Date Noted  . Language barrier 12/17/2014    Junious Silk , PT 02/14/2020, 8:42 AM  [facility]  [facility] [address], [address] [place], [place], [address] Phone: [phone]   Fax:  [phone]  Name: Trenda Corliss MRN: [id number] Date of Birth: [birth year]

## 2020-02-14 NOTE — Addendum Note (Signed)
Addended by: Beatris Si on: 02/14/2020 08:51 AM   Modules accepted: Orders

## 2020-02-14 NOTE — Therapy (Signed)
[facility] [address] [place], [place], [address] Phone: [phone]   Fax:  [phone]  Physical Therapy Evaluation  Patient Details  Name: Karen Burke MRN: [id number] Date of Birth: [birth year] Referring Provider (PT): Willodean Rosenthal, MD   Encounter Date: 02/13/2020  PT End of Session - 02/13/20 1556    Visit Number  1    Date for PT Re-Evaluation  06/04/20    PT Start Time  1544   arrived late   PT Stop Time  1614    PT Time Calculation (min)  30 min    Activity Tolerance  Patient tolerated treatment well    Behavior During Therapy  [facility] for tasks assessed/performed       Past Medical History:  Diagnosis Date  . Anemia   . ASCUS with positive high risk HPV   . Pyelonephritis affecting pregnancy in second trimester 03/11/2016  . Vaginal Pap smear, abnormal     Past Surgical History:  Procedure Laterality Date  . COLPOSCOPY    . cyst removal arm      There were no vitals filed for this visit.   Subjective Assessment - 02/13/20 1548    Subjective  Pt has her husband present to interpret because both report she has had trouble understanding when going to the GYN.  States ever since giving birth to her children (ages 3 and 4 currently) has had frequency and unable to empty the bladder.  Pt had episiotomy with both deliveries and stitches.  Pt denies any pain.  States extremem urgency.  Urinates about 1/4 cup every time.    Currently in Pain?  No/denies         [facility] PT Assessment - 02/14/20 0001      Assessment   Medical Diagnosis  N39.3 (ICD-10-CM) - Stress incontinence    Referring Provider (PT)  Willodean Rosenthal, MD    Prior Therapy  No      Precautions   Precautions  None      Restrictions   Weight Bearing Restrictions  No      Balance Screen   Has the patient fallen in the past 6 months  No      Home Environment   Living Environment  Private residence    Living  Arrangements  Spouse/significant other;Children      Prior Function   Level of Independence  Independent      Cognition   Overall Cognitive Status  Within Functional Limits for tasks assessed      Posture/Postural Control   Posture/Postural Control  Postural limitations    Postural Limitations  Increased lumbar lordosis      ROM / Strength   AROM / PROM / Strength  Strength;AROM      AROM   Overall AROM Comments  lumbar 50%      Strength   Overall Strength Comments  core 4/5      Palpation   Palpation comment  decreased ribcage movement during diaphragmatic breath; umbilical DRA 2 fingers      Ambulation/Gait   Gait Pattern  Decreased stride length;Wide base of support                  Objective measurements completed on examination: See above findings.    Pelvic Floor Special Questions - 02/14/20 0001    Prior Pelvic/Prostate Exam  Yes    Are you Pregnant or attempting pregnancy?  No    Prior Pregnancies  Yes  Number of Pregnancies  2    Number of Vaginal Deliveries  2    Any difficulty with labor and deliveries  Yes    Episiotomy Performed  Yes    Diastasis Recti  2 fingers at umbilicus    Currently Sexually Active  Yes    Is this Painful  No    Urinary Leakage  Yes    How often  a little before sitting on toilet    Pad use  n    Activities that cause leaking  With strong urge    Urinary urgency  Yes    Urinary frequency  3x/hour    Falling out feeling (prolapse)  No    Skin Integrity  Intact    Perineal Body/Introitus   Descended    Pelvic Floor Internal Exam  pt identity confirmed and informed consent given to perform internal soft tissue assessment    Exam Type  Vaginal    Palpation  TP and peroneal body low tone, tight attachment of pubococcygeus    Strength  weak squeeze, no lift    Strength # of seconds  5    Tone  mixed                 PT Short Term Goals - 02/14/20 0826      PT SHORT TERM GOAL #1   Title  Pt will be ind  with initial HEP    Time  4    Period  Weeks    Status  New    Target Date  03/12/20      PT SHORT TERM GOAL #2   Title  Pt will be ind with urge to void and double void techniques for improved bladder control    Time  4    Period  Weeks    Status  New    Target Date  03/12/20        PT Long Term Goals - 02/14/20 0822      PT LONG TERM GOAL #1   Title  Pt will report at least 60% less urgency    Time  16    Period  Weeks    Status  New    Target Date  06/04/20      PT LONG TERM GOAL #2   Title  Pt will be able to get to the bathroom and sit down before the stream starts    Time  16    Period  Weeks    Status  New    Target Date  06/04/20      PT LONG TERM GOAL #3   Title  Pt will report she feels a complete emptying of her bladder when she voids    Baseline  about 1/4 cup at a time    Time  16    Period  Weeks    Status  New    Target Date  06/04/20      PT LONG TERM GOAL #4   Title  Pt will demonstrate 3/5 MMT pelvic floor and ability to perform excursion for improved muscle function and stability of the bladder    Time  16    Period  Weeks    Status  New    Target Date  06/04/20      PT LONG TERM GOAL #5   Title  Pt will be able to sustain a pelvic floor contraction for at least 15 seconds for improved stability during functional movements  such as lifting    Time  16    Period  Weeks    Status  New    Target Date  06/04/20             Plan - 02/14/20 0830    Clinical Impression Statement  Pt arrives at clinic today due to urinary frequency and dysuria.  Pt arrives with husband today due to reporting difficutly with the interpreters being able to translate to her in a way that she can understand.  Pt is arrived late today due to extra time needed for interpretation so today's evalution includes assessment only.  Gait, posture and abdominal strength as well as pelvic floor were assessed today.  Pt demonstrates increased lumbar lordosis and excess  abdominal adipose is present limiting her AROM lumbar flexion and ribcage movements.  She has diastasis of rectus abdominus of 2 fingers at the umbilicus.  Pt has tenting of upper abdomen when performing strength test.  Pt has 4/5 MMT of core strength.  Pt has 2/5 MMT pelvic floor with high tone palpated anteriorly and low tone posteriorly.  She had episiotomy x 2 during her labor and delivery of her children.  Pt can sustain pelvic floor contraction for 5 seconds.  She will benefit from skilled PT to address these impairments as well as others found in upcoming session in order to immprove her function and qulaity of life.    Personal Factors and Comorbidities  Time since onset of injury/illness/exacerbation;Comorbidity 2    Comorbidities  2 episiotomies; extra time needed for interpretation    Examination-Activity Limitations  Continence;Toileting    Stability/Clinical Decision Making  Evolving/Moderate complexity    Clinical Decision Making  Moderate    Rehab Potential  Excellent    PT Frequency  2x / week   will reduce frequency as able; may only be attending 1x/week   PT Duration  --   16 weeks   PT Treatment/Interventions  ADLs/Self Care Home Management;Biofeedback;Cryotherapy;Electrical Stimulation;Moist Heat;Neuromuscular re-education;Therapeutic exercise;Therapeutic activities;Patient/family education;Manual techniques;Dry needling;Passive range of motion;Taping    PT Next Visit Plan  double void, toileting, urge to void, breathing and bulging; internal STM to release attachments at pubic bone    PT Home Exercise Plan  issue next - urge to void and double void, breathing and stretch    Consulted and Agree with Plan of Care  Patient       Patient will benefit from skilled therapeutic intervention in order to improve the following deficits and impairments:  Abnormal gait, Postural dysfunction, Decreased strength, Decreased coordination, Obesity, Increased muscle spasms, Decreased  endurance  Visit Diagnosis: Muscle weakness (generalized)  Other lack of coordination  Cramp and spasm     Problem List Patient Active Problem List   Diagnosis Date Noted  . Language barrier 12/17/2014    Junious Silk, PT 02/14/2020, 8:49 AM   [facility] [address] [place], [place], [address] Phone: [phone]   Fax:  [phone]  Name: Karen Burke MRN: [id number] Date of Birth: 19-Aug-1986

## 2020-03-18 ENCOUNTER — Ambulatory Visit: Payer: BC Managed Care – PPO | Admitting: Physical Therapy

## 2020-03-26 ENCOUNTER — Ambulatory Visit: Payer: BC Managed Care – PPO | Attending: Obstetrics & Gynecology | Admitting: Physical Therapy

## 2020-03-26 ENCOUNTER — Other Ambulatory Visit: Payer: Self-pay

## 2020-03-26 ENCOUNTER — Encounter: Payer: Self-pay | Admitting: Physical Therapy

## 2020-03-26 DIAGNOSIS — R252 Cramp and spasm: Secondary | ICD-10-CM | POA: Diagnosis present

## 2020-03-26 DIAGNOSIS — M6281 Muscle weakness (generalized): Secondary | ICD-10-CM | POA: Insufficient documentation

## 2020-03-26 DIAGNOSIS — R278 Other lack of coordination: Secondary | ICD-10-CM | POA: Diagnosis present

## 2020-03-26 NOTE — Patient Instructions (Addendum)
Double Voiding can be a very useful technique to help overcome incomplete emptying of your bladder.  Incomplete emptying of urine can result in leakage after using the bathroom and increase the risk of urinary tract infection.   Initial Void: When you first sit down to urinate, ensure optimal positioning for bladder emptying by following these guidelines for toileting posture: Sit on the toilet seat - don't hover over the seat Support your trunk by placing your hands on your knees or thighs Spread your knees and hips wide Position your feet flat on the floor or elevate feet on phone books, foot stool (Squatty Potty), or wrapped toilet paper rolls (if having knees above hips helps you empty) Lean forward from your hips Maintain the normal inward curve in your lower back   Repeated Void: After your initial void is complete, follow these movement patterns and attempt going to the bathroom again. Stand up Rotate your hips as if doing hula hoop in one direction Rotate using the same action in the other direction Rock your hips and pelvis back and forwards ("pelvic tilts") Rock your hips and pelvis side to side ("tail wag") Sit back down and repeat your voiding technique This technique can be repeated as many times as you choose to help you empty your bladder more effectively.  Breathing during bowel movement:  1. Back straight - sit up low back to upper back not rounding forward - look straight ahead 2. Lean forward - only as far as possible with back staying straight 3. Breathe - slow as you can inhaling down into your belly and feeling pressure on the pelvic floor 4. Hard belly - keep belly like a hard ball on the max inhale 5. Blow hard - like blowing up a balloon and pressure down into pelvic floor 6. Squeeze and lift - tighten pelvic floor after BM to reset everything back into place   [facility] [address], [address] [place], [place] [address] Phone #  [phone] Fax [phone] Access Code: [email or web address] URL: [email or web address] Date: 03/26/2020 Prepared by: Dwana Curd  Exercises Seated Hamstring Stretch - 1 x daily - 7 x weekly - 3 reps - 1 sets - 30 sec hold Supine Figure 4 Piriformis Stretch - 1 x daily - 7 x weekly - 3 reps - 1 sets - 30 sec hold Hooklying Single Knee to Chest Stretch - 1 x daily - 7 x weekly - 5 reps - 1 sets - 20 sec hold Sidelying Thoracic and Shoulder Rotation - 1 x daily - 7 x weekly - 1 sets - 10 reps - 3 sec hold

## 2020-03-26 NOTE — Therapy (Signed)
[facility] [address], [place], [place], [address] Phone: [phone]   Fax:  [phone]  Physical Therapy Treatment  Patient Details  Name: Karen Burke MRN: [id number] Date of Birth: July 07, 1986 Referring Provider (PT): Lavonia Drafts, MD   Encounter Date: 03/26/2020   PT End of Session - 03/26/20 1701    Visit Number 2    Date for PT Re-Evaluation 06/04/20    PT Start Time 2707    PT Stop Time 1655    PT Time Calculation (min) 40 min    Activity Tolerance Patient tolerated treatment well    Behavior During Therapy [facility] for tasks assessed/performed           Past Medical History:  Diagnosis Date  . Anemia   . ASCUS with positive high risk HPV   . Pyelonephritis affecting pregnancy in second trimester 03/11/2016  . Vaginal Pap smear, abnormal     Past Surgical History:  Procedure Laterality Date  . COLPOSCOPY    . cyst removal arm      There were no vitals filed for this visit.   Subjective Assessment - 03/26/20 1704    Subjective Pt sates still having urgency and only able to pee a little when she goes.  Pt has husband on the phone to interpret.    Patient is accompained by: Interpreter;Family member    Currently in Pain? No/denies                             [facility] PT Treatment/Exercise - 03/26/20 0001      Self-Care   Self-Care Other Self-Care Comments    Other Self-Care Comments  urge technique; toilet technique; breathing with intial HEP stretches      Neuro Re-ed    Neuro Re-ed Details  breathing and bulging pelvic floor; exhale with pelvic tilt      Exercises   Exercises Lumbar      Lumbar Exercises: Stretches   Active Hamstring Stretch Right;Left;1 rep;10 seconds    Single Knee to Chest Stretch Right;Left;3 reps    Double Knee to Chest Stretch 2 reps    Pelvic Tilt 10 reps    Pelvic Tilt Limitations max cues needed    Figure 4 Stretch 2 reps;20  seconds;Supine;With overpressure    Other Lumbar Stretch Exercise thoracic rotation - sidelying 10x                  PT Education - 03/26/20 1656    Education Details Access Code: [email or web address]    Person(s) Educated Patient    Methods Explanation;Demonstration;Verbal cues;Handout;Tactile cues    Comprehension Verbalized understanding;Returned demonstration            PT Short Term Goals - 02/14/20 0826      PT SHORT TERM GOAL #1   Title Pt will be ind with initial HEP    Time 4    Period Weeks    Status New    Target Date 03/12/20      PT SHORT TERM GOAL #2   Title Pt will be ind with urge to void and double void techniques for improved bladder control    Time 4    Period Weeks    Status New    Target Date 03/12/20             PT Long Term Goals - 02/14/20 0822      PT LONG  TERM GOAL #1   Title Pt will report at least 60% less urgency    Time 16    Period Weeks    Status New    Target Date 06/04/20      PT LONG TERM GOAL #2   Title Pt will be able to get to the bathroom and sit down before the stream starts    Time 16    Period Weeks    Status New    Target Date 06/04/20      PT LONG TERM GOAL #3   Title Pt will report she feels a complete emptying of her bladder when she voids    Baseline about 1/4 cup at a time    Time 16    Period Weeks    Status New    Target Date 06/04/20      PT LONG TERM GOAL #4   Title Pt will demonstrate 3/5 MMT pelvic floor and ability to perform excursion for improved muscle function and stability of the bladder    Time 16    Period Weeks    Status New    Target Date 06/04/20      PT LONG TERM GOAL #5   Title Pt will be able to sustain a pelvic floor contraction for at least 15 seconds for improved stability during functional movements such as lifting    Time 16    Period Weeks    Status New    Target Date 06/04/20                 Plan - 03/26/20 1707    Clinical Impression Statement Pt declined  internal or external soft tissue work unable to understand the reason but states she would be open to it next time.  Pt did well with toileting and urge techniques.  Needed cues for breathing in through the nose and slowly breathing.  Pt has not met goals due to intital treatment since eval.  she will benefit from skilled PT per POC    PT Treatment/Interventions ADLs/Self Care Home Management;Biofeedback;Cryotherapy;Electrical Stimulation;Moist Heat;Neuromuscular re-education;Therapeutic exercise;Therapeutic activities;Patient/family education;Manual techniques;Dry needling;Passive range of motion;Taping    PT Next Visit Plan TrA without doming; breathing techniques, ask about internal STM if tolerated; starting with abdominal and lumbar fascial release    PT Home Exercise Plan Access Code: [email or web address]    Consulted and Agree with Plan of Care Patient;Family member/caregiver    Family Member Consulted husbabd is interpreting           Patient will benefit from skilled therapeutic intervention in order to improve the following deficits and impairments:  Abnormal gait, Postural dysfunction, Decreased strength, Decreased coordination, Obesity, Increased muscle spasms, Decreased endurance  Visit Diagnosis: Muscle weakness (generalized)  Other lack of coordination  Cramp and spasm     Problem List Patient Active Problem List   Diagnosis Date Noted  . Language barrier 12/17/2014    Karen [facility], PT 03/26/2020, 5:19 PM  [facility] [address], [place], [place], [address] Phone: [phone]   Fax:  [phone]  Name: Karen Burke MRN: [id number] Date of Birth: 06-05-1986

## 2020-03-26 NOTE — Therapy (Signed)
[facility] [address] [place], [place], [address] Phone: [phone]   Fax:  [phone]  Physical Therapy Treatment  Patient Details  Name: Karen Burke MRN: [id number] Date of Birth: 1986-07-02 Referring Provider (PT): Lavonia Drafts, MD   Encounter Date: 03/26/2020   PT End of Session - 03/26/20 1701    Visit Number 2    Date for PT Re-Evaluation 06/04/20    PT Start Time 5027    PT Stop Time 1655    PT Time Calculation (min) 40 min    Activity Tolerance Patient tolerated treatment well    Behavior During Therapy [facility] for tasks assessed/performed           Past Medical History:  Diagnosis Date   Anemia    ASCUS with positive high risk HPV    Pyelonephritis affecting pregnancy in second trimester 03/11/2016   Vaginal Pap smear, abnormal     Past Surgical History:  Procedure Laterality Date   COLPOSCOPY     cyst removal arm      There were no vitals filed for this visit.   Subjective Assessment - 03/26/20 1704    Subjective Pt sates still having urgency and only able to pee a little when she goes.  Pt has husband on the phone to interpret.    Patient is accompained by: Interpreter;Family member    Currently in Pain? No/denies                                     PT Education - 03/26/20 1656    Education Details Access Code: [email or web address]    Person(s) Educated Patient    Methods Explanation;Demonstration;Verbal cues;Handout;Tactile cues    Comprehension Verbalized understanding;Returned demonstration            PT Short Term Goals - 02/14/20 0826      PT SHORT TERM GOAL #1   Title Pt will be ind with initial HEP    Time 4    Period Weeks    Status New    Target Date 03/12/20      PT SHORT TERM GOAL #2   Title Pt will be ind with urge to void and double void techniques for improved bladder control    Time 4    Period Weeks    Status New    Target  Date 03/12/20             PT Long Term Goals - 02/14/20 0822      PT LONG TERM GOAL #1   Title Pt will report at least 60% less urgency    Time 16    Period Weeks    Status New    Target Date 06/04/20      PT LONG TERM GOAL #2   Title Pt will be able to get to the bathroom and sit down before the stream starts    Time 16    Period Weeks    Status New    Target Date 06/04/20      PT LONG TERM GOAL #3   Title Pt will report she feels a complete emptying of her bladder when she voids    Baseline about 1/4 cup at a time    Time 16    Period Weeks    Status New    Target Date 06/04/20      PT LONG TERM  GOAL #4   Title Pt will demonstrate 3/5 MMT pelvic floor and ability to perform excursion for improved muscle function and stability of the bladder    Time 16    Period Weeks    Status New    Target Date 06/04/20      PT LONG TERM GOAL #5   Title Pt will be able to sustain a pelvic floor contraction for at least 15 seconds for improved stability during functional movements such as lifting    Time 16    Period Weeks    Status New    Target Date 06/04/20                 Plan - 03/26/20 1707    Clinical Impression Statement Pt declined internal or external soft tissue work unable to understand the reason but states she would be open to it next time.  Pt did well with toileting and urge techniques.  Needed cues for breathing in through the nose and slowly breathing.  Pt has not met goals due to intital treatment since eval.  she will benefit from skilled PT per POC    PT Treatment/Interventions ADLs/Self Care Home Management;Biofeedback;Cryotherapy;Electrical Stimulation;Moist Heat;Neuromuscular re-education;Therapeutic exercise;Therapeutic activities;Patient/family education;Manual techniques;Dry needling;Passive range of motion;Taping    PT Next Visit Plan TrA without doming; breathing techniques, ask about internal STM if tolerated; starting with abdominal and lumbar  fascial release    PT Home Exercise Plan Access Code: [email or web address]    Consulted and Agree with Plan of Care Patient;Family member/caregiver    Family Member Consulted husbabd is interpreting           Patient will benefit from skilled therapeutic intervention in order to improve the following deficits and impairments:  Abnormal gait, Postural dysfunction, Decreased strength, Decreased coordination, Obesity, Increased muscle spasms, Decreased endurance  Visit Diagnosis: Muscle weakness (generalized)  Other lack of coordination  Cramp and spasm     Problem List Patient Active Problem List   Diagnosis Date Noted   Language barrier 12/17/2014    Jule Ser, PT 03/26/2020, 5:13 PM   [facility] [address] [place], [place], [address] Phone: [phone]   Fax:  [phone]  Name: Karen Burke MRN: [id number] Date of Birth: 1986/09/22

## 2020-04-01 ENCOUNTER — Encounter: Payer: Self-pay | Admitting: Physical Therapy

## 2020-04-01 ENCOUNTER — Other Ambulatory Visit: Payer: Self-pay

## 2020-04-01 ENCOUNTER — Ambulatory Visit: Payer: BC Managed Care – PPO | Admitting: Physical Therapy

## 2020-04-01 DIAGNOSIS — M6281 Muscle weakness (generalized): Secondary | ICD-10-CM | POA: Diagnosis not present

## 2020-04-01 DIAGNOSIS — R278 Other lack of coordination: Secondary | ICD-10-CM

## 2020-04-01 DIAGNOSIS — R252 Cramp and spasm: Secondary | ICD-10-CM

## 2020-04-01 NOTE — Therapy (Signed)
[facility] [address] [place], [place], [address] Phone: [phone]   Fax:  [phone]  Physical Therapy Treatment  Patient Details  Name: Karen Burke MRN: [id number] Date of Birth: 1986/05/05 Referring Provider (PT): Willodean Rosenthal, MD   Encounter Date: 04/01/2020   PT End of Session - 04/01/20 1720    Visit Number 3    Date for PT Re-Evaluation 06/04/20    PT Start Time 1538   arrived late   PT Stop Time 1616    PT Time Calculation (min) 38 min    Activity Tolerance Patient tolerated treatment well    Behavior During Therapy [facility] for tasks assessed/performed           Past Medical History:  Diagnosis Date  . Anemia   . ASCUS with positive high risk HPV   . Pyelonephritis affecting pregnancy in second trimester 03/11/2016  . Vaginal Pap smear, abnormal     Past Surgical History:  Procedure Laterality Date  . COLPOSCOPY    . cyst removal arm      There were no vitals filed for this visit.   Subjective Assessment - 04/01/20 1726    Subjective Reports no changes.  Reports she did not do the urge drills after reviewing realized she did not understand previously. she wants to avoid any treatments where she has to get undressed.    Patient is accompained by: Interpreter;Family member    Currently in Pain? No/denies                             [facility] Adult PT Treatment/Exercise - 04/01/20 0001      Self-Care   Other Self-Care Comments  reviewed urge techniques and education on drinking more water      Neuro Re-ed    Neuro Re-ed Details  tactile cues for exercises      Lumbar Exercises: Supine   Ab Set 15 reps;1 second;10 reps;2 seconds    AB Set Limitations kegel quick flicks with tactile cues outside clothing; then 2 sec hold    Bridge Limitations mini bridge with kegel 2 sec hold    Other Supine Lumbar Exercises breathe and relax pelvic floor supine on wedge pillow -  hamstring and knee to chest stretch - 3x 10 sec      Lumbar Exercises: Quadruped   Other Quadruped Lumbar Exercises attempt child pose with pillows - unable to completely relax                    PT Short Term Goals - 02/14/20 0826      PT SHORT TERM GOAL #1   Title Pt will be ind with initial HEP    Time 4    Period Weeks    Status New    Target Date 03/12/20      PT SHORT TERM GOAL #2   Title Pt will be ind with urge to void and double void techniques for improved bladder control    Time 4    Period Weeks    Status New    Target Date 03/12/20             PT Long Term Goals - 02/14/20 2979      PT LONG TERM GOAL #1   Title Pt will report at least 60% less urgency    Time 16    Period Weeks    Status New  Target Date 06/04/20      PT LONG TERM GOAL #2   Title Pt will be able to get to the bathroom and sit down before the stream starts    Time 16    Period Weeks    Status New    Target Date 06/04/20      PT LONG TERM GOAL #3   Title Pt will report she feels a complete emptying of her bladder when she voids    Baseline about 1/4 cup at a time    Time 16    Period Weeks    Status New    Target Date 06/04/20      PT LONG TERM GOAL #4   Title Pt will demonstrate 3/5 MMT pelvic floor and ability to perform excursion for improved muscle function and stability of the bladder    Time 16    Period Weeks    Status New    Target Date 06/04/20      PT LONG TERM GOAL #5   Title Pt will be able to sustain a pelvic floor contraction for at least 15 seconds for improved stability during functional movements such as lifting    Time 16    Period Weeks    Status New    Target Date 06/04/20                 Plan - 04/01/20 1721    Clinical Impression Statement Pt needed to review urge techniques as she did not remember them and husband did not recall either.  pt refused direct contact with pelvic floor either internal or external with biofeedback.   She is more willing to try exercises.  Patient was informed of reasoning for wanting to start with ways to relax the pelvic floor muscles and understands that it can take longer without directly addressing the pelvic floor muscles and she is okay with this.  Pt was able to perfrom basic pelvic floor strengthening in order to work on urge drills and begin to work on improved endurance of pelvic floor.  Exercises added to HEP today.  Recommend patient to continue with POC.    PT Treatment/Interventions ADLs/Self Care Home Management;Biofeedback;Cryotherapy;Electrical Stimulation;Moist Heat;Neuromuscular re-education;Therapeutic exercise;Therapeutic activities;Patient/family education;Manual techniques;Dry needling;Passive range of motion;Taping    PT Next Visit Plan TrA without doming; breathing techniques, starting with abdominal and lumbar fascial release    PT Home Exercise Plan Access Code: [email or web address]    Consulted and Agree with Plan of Care Patient;Family member/caregiver    Family Member Consulted husbabd is interpreting           Patient will benefit from skilled therapeutic intervention in order to improve the following deficits and impairments:  Abnormal gait, Postural dysfunction, Decreased strength, Decreased coordination, Obesity, Increased muscle spasms, Decreased endurance  Visit Diagnosis: Muscle weakness (generalized)  Other lack of coordination  Cramp and spasm     Problem List Patient Active Problem List   Diagnosis Date Noted  . Language barrier 12/17/2014    Junious Silk, PT 04/01/2020, 5:31 PM  [facility] [address], [address] [place], [place], [address] Phone: [phone]   Fax:  [phone]  Name: Karen Burke MRN: [id number] Date of Birth: November 16, 1985

## 2020-04-09 ENCOUNTER — Ambulatory Visit: Payer: Managed Care, Other (non HMO) | Admitting: Physical Therapy

## 2020-04-15 ENCOUNTER — Ambulatory Visit: Payer: Managed Care, Other (non HMO) | Attending: Obstetrics & Gynecology | Admitting: Physical Therapy

## 2020-04-15 DIAGNOSIS — R278 Other lack of coordination: Secondary | ICD-10-CM | POA: Insufficient documentation

## 2020-04-15 DIAGNOSIS — R252 Cramp and spasm: Secondary | ICD-10-CM | POA: Insufficient documentation

## 2020-04-15 DIAGNOSIS — M6281 Muscle weakness (generalized): Secondary | ICD-10-CM | POA: Insufficient documentation

## 2020-04-23 ENCOUNTER — Encounter: Payer: Self-pay | Admitting: Physical Therapy

## 2020-04-23 ENCOUNTER — Ambulatory Visit: Payer: Managed Care, Other (non HMO) | Admitting: Physical Therapy

## 2020-04-23 ENCOUNTER — Other Ambulatory Visit: Payer: Self-pay

## 2020-04-23 DIAGNOSIS — R278 Other lack of coordination: Secondary | ICD-10-CM | POA: Diagnosis present

## 2020-04-23 DIAGNOSIS — R252 Cramp and spasm: Secondary | ICD-10-CM

## 2020-04-23 DIAGNOSIS — M6281 Muscle weakness (generalized): Secondary | ICD-10-CM | POA: Diagnosis present

## 2020-04-23 NOTE — Patient Instructions (Signed)
Access Code: [email or web address] URL: [email or web address] Date: 04/23/2020 Prepared by: Dwana Curd  Exercises Seated Hamstring Stretch - 1 x daily - 7 x weekly - 3 reps - 1 sets - 30 sec hold Supine Figure 4 Piriformis Stretch - 1 x daily - 7 x weekly - 3 reps - 1 sets - 30 sec hold Hooklying Single Knee to Chest Stretch - 1 x daily - 7 x weekly - 5 reps - 1 sets - 20 sec hold Sidelying Thoracic and Shoulder Rotation - 1 x daily - 7 x weekly - 1 sets - 10 reps - 3 sec hold Supine Hip Adductor Squeeze with Small Ball - 3 x daily - 7 x weekly - 1 sets - 10 reps - 3 sec hold Supine Bridge with Pelvic Floor Contraction - 1 x daily - 7 x weekly - 3 sets - 10 reps Supine Pelvic Tilt - 1 x daily - 7 x weekly - 3 sets - 10 reps Hooklying Small March - 1 x daily - 7 x weekly - 10 reps - 2 sets Quadruped Pelvic Tilt - 1 x daily - 7 x weekly - 3 sets - 10 reps Sit to Stand with Ball Between Knees - 1 x daily - 7 x weekly - 3 sets - 10 reps Seated Long Arc Quad with Hip Adduction - 1 x daily - 7 x weekly - 3 sets - 10 reps Seated Hip Abduction with Resistance - 1 x daily - 7 x weekly - 3 sets - 10 reps

## 2020-04-23 NOTE — Therapy (Addendum)
[facility] [address], [place], [place], [address] Phone: [phone]   Fax:  [phone]  Physical Therapy Treatment  Patient Details  Name: Karen Burke MRN: [id number] Date of Birth: May 16, 1986 Referring Provider (PT): Lavonia Drafts, MD   Encounter Date: 04/23/2020   PT End of Session - 04/23/20 1535    Visit Number 4    Date for PT Re-Evaluation 06/04/20    PT Start Time 9390    PT Stop Time 1611    PT Time Calculation (min) 38 min    Activity Tolerance Patient tolerated treatment well    Behavior During Therapy [facility] for tasks assessed/performed           Past Medical History:  Diagnosis Date  . Anemia   . ASCUS with positive high risk HPV   . Pyelonephritis affecting pregnancy in second trimester 03/11/2016  . Vaginal Pap smear, abnormal     Past Surgical History:  Procedure Laterality Date  . COLPOSCOPY    . cyst removal arm      There were no vitals filed for this visit.   Subjective Assessment - 04/23/20 1710    Subjective Pt states symptoms are better using urge to void techniques. States she will sometimes having pain around lower abdomen.    Patient is accompained by: Interpreter;Family member   husband   Currently in Pain? No/denies                             [facility] Adult PT Treatment/Exercise - 04/23/20 0001      Neuro Re-ed    Neuro Re-ed Details  TC and demo given throughout to activate TrA and perform pelvic tilt      Exercises   Exercises Other Exercises    Other Exercises  all performed seen as in HEP: Access Code: [email or web address]      Lumbar Exercises: Standing   Other Standing Lumbar Exercises pelvic tilt against the wall      Lumbar Exercises: Seated   Sit to Stand 15 reps    Other Seated Lumbar Exercises clam, LAQ ball squeeze      Lumbar Exercises: Supine   Pelvic Tilt 15 reps    Pelvic Tilt Limitations maintain tilt and UE overhead with 6lb;  marching                    PT Short Term Goals - 02/14/20 0826      PT SHORT TERM GOAL #1   Title Pt will be ind with initial HEP    Time 4    Period Weeks    Status New    Target Date 03/12/20      PT SHORT TERM GOAL #2   Title Pt will be ind with urge to void and double void techniques for improved bladder control    Time 4    Period Weeks    Status New    Target Date 03/12/20             PT Long Term Goals - 02/14/20 0822      PT LONG TERM GOAL #1   Title Pt will report at least 60% less urgency    Time 16    Period Weeks    Status New    Target Date 06/04/20      PT LONG TERM GOAL #2   Title Pt will be able to get to  the bathroom and sit down before the stream starts    Time 16    Period Weeks    Status New    Target Date 06/04/20      PT LONG TERM GOAL #3   Title Pt will report she feels a complete emptying of her bladder when she voids    Baseline about 1/4 cup at a time    Time 16    Period Weeks    Status New    Target Date 06/04/20      PT LONG TERM GOAL #4   Title Pt will demonstrate 3/5 MMT pelvic floor and ability to perform excursion for improved muscle function and stability of the bladder    Time 16    Period Weeks    Status New    Target Date 06/04/20      PT LONG TERM GOAL #5   Title Pt will be able to sustain a pelvic floor contraction for at least 15 seconds for improved stability during functional movements such as lifting    Time 16    Period Weeks    Status New    Target Date 06/04/20                 Plan - 04/23/20 1618    Clinical Impression Statement Pt was able to porgress towards functional goals enough to feel like she is ready to discharge today.  Pt felt better with pelvic tilt and reports her abdomen felt better in that position. Pt was given HEP and feels confident she can continue to do at home and she will d/c today.    PT Treatment/Interventions ADLs/Self Care Home  Management;Biofeedback;Cryotherapy;Electrical Stimulation;Moist Heat;Neuromuscular re-education;Therapeutic exercise;Therapeutic activities;Patient/family education;Manual techniques;Dry needling;Passive range of motion;Taping    PT Next Visit Plan d/c today    PT Home Exercise Plan Access Code: [email or web address]    Consulted and Agree with Plan of Care Patient;Family member/caregiver    Family Member Consulted husbabd is interpreting           Patient will benefit from skilled therapeutic intervention in order to improve the following deficits and impairments:  Abnormal gait, Postural dysfunction, Decreased strength, Decreased coordination, Obesity, Increased muscle spasms, Decreased endurance  Visit Diagnosis: Muscle weakness (generalized)  Other lack of coordination  Cramp and spasm     Problem List Patient Active Problem List   Diagnosis Date Noted  . Language barrier 12/17/2014    Jule Ser, PT 04/23/2020, 5:18 PM  [place] [address], [place], [place], [address] Phone: [phone]   Fax:  [phone]  Name: Karen Burke MRN: [id number] Date of Birth: December 21, 1985  PHYSICAL THERAPY DISCHARGE SUMMARY  Visits from Start of Care: 4  Current functional level related to goals / functional outcomes: See above   Remaining deficits: See above   Education / Equipment: HEP  Plan: Patient agrees to discharge.  Patient goals were partially met. Patient is being discharged due to being pleased with the current functional level.  ?????     [employer], PT 04/23/20 5:20 PM

## 2020-12-15 ENCOUNTER — Inpatient Hospital Stay (HOSPITAL_COMMUNITY): Payer: Managed Care, Other (non HMO)

## 2020-12-15 ENCOUNTER — Other Ambulatory Visit: Payer: Self-pay

## 2020-12-15 ENCOUNTER — Encounter (HOSPITAL_COMMUNITY): Payer: Self-pay | Admitting: Obstetrics and Gynecology

## 2020-12-15 ENCOUNTER — Inpatient Hospital Stay (HOSPITAL_COMMUNITY)
Admission: AD | Admit: 2020-12-15 | Discharge: 2020-12-15 | Disposition: A | Discharge status: Home / Self Care | Payer: Managed Care, Other (non HMO) | Attending: Obstetrics and Gynecology | Admitting: Obstetrics and Gynecology

## 2020-12-15 DIAGNOSIS — Z3A01 Less than 8 weeks gestation of pregnancy: Secondary | ICD-10-CM | POA: Diagnosis not present

## 2020-12-15 DIAGNOSIS — O209 Hemorrhage in early pregnancy, unspecified: Secondary | ICD-10-CM | POA: Diagnosis present

## 2020-12-15 DIAGNOSIS — Z3A08 8 weeks gestation of pregnancy: Secondary | ICD-10-CM | POA: Diagnosis not present

## 2020-12-15 DIAGNOSIS — Z3491 Encounter for supervision of normal pregnancy, unspecified, first trimester: Secondary | ICD-10-CM

## 2020-12-15 DIAGNOSIS — R1030 Lower abdominal pain, unspecified: Secondary | ICD-10-CM | POA: Insufficient documentation

## 2020-12-15 DIAGNOSIS — O3680X0 Pregnancy with inconclusive fetal viability, not applicable or unspecified: Secondary | ICD-10-CM | POA: Insufficient documentation

## 2020-12-15 DIAGNOSIS — O26891 Other specified pregnancy related conditions, first trimester: Secondary | ICD-10-CM | POA: Diagnosis not present

## 2020-12-15 DIAGNOSIS — O200 Threatened abortion: Secondary | ICD-10-CM | POA: Diagnosis not present

## 2020-12-15 LAB — HCG, QUANTITATIVE, PREGNANCY: hCG, Beta Chain, Quant, S: 9312 m[IU]/mL — ABNORMAL HIGH (ref <5)

## 2020-12-15 LAB — POCT PREGNANCY, URINE: Preg Test, Ur: POSITIVE — AB

## 2020-12-15 LAB — CBC
HCT: 33.4 % — ABNORMAL LOW (ref 36.0–46.0)
Hemoglobin: 10.5 g/dL — ABNORMAL LOW (ref 12.0–15.0)
MCH: 25.4 pg — ABNORMAL LOW (ref 26.0–34.0)
MCHC: 31.4 g/dL (ref 30.0–36.0)
MCV: 80.7 fL (ref 80.0–100.0)
Platelets: 319 10*3/uL (ref 150–400)
RBC: 4.14 MIL/uL (ref 3.87–5.11)
RDW: 16 % — ABNORMAL HIGH (ref 11.5–15.5)
WBC: 7.5 10*3/uL (ref 4.0–10.5)
nRBC: 0 % (ref 0.0–0.2)

## 2020-12-15 LAB — URINALYSIS, ROUTINE W REFLEX MICROSCOPIC
Bacteria, UA: NONE SEEN
Bilirubin Urine: NEGATIVE
Glucose, UA: NEGATIVE mg/dL
Ketones, ur: NEGATIVE mg/dL
Leukocytes,Ua: NEGATIVE
Nitrite: NEGATIVE
Protein, ur: NEGATIVE mg/dL
Specific Gravity, Urine: 1.02 (ref 1.005–1.030)
pH: 7 (ref 5.0–8.0)

## 2020-12-15 MED ORDER — ACETAMINOPHEN 500 MG PO TABS
1000.0000 mg | ORAL_TABLET | Freq: Once | ORAL | Status: AC
  Administered 2020-12-15: 1000 mg via ORAL
  Filled 2020-12-15: qty 2

## 2020-12-15 NOTE — MAU Provider Note (Signed)
History     CSN: [id number]  Arrival date and time: 12/15/20 1006   Event Date/Time   First Provider Initiated Contact with Patient 12/15/20 1123      Chief Complaint  Patient presents with  . Vaginal Bleeding  . Abdominal Pain  . Possible Pregnancy   HPI Karen Burke is a 35 y.o. [id number] at [redacted]w[redacted]d who presents with vaginal bleeding and abdominal pain. Symptoms started earlier today. Reports minimal pink spotting on toilet paper. Not saturating pads or passing blood clots. Reports lower abdominal cramping that she rates 5/10. Hasn't treated symptoms. Nothing makes better or worse. Denies any other symptoms.   OB History    Gravida  5   Para  2   Term  2   Preterm      AB  2   Living  2     SAB  1   IAB      Ectopic      Multiple  0   Live Births  2           Past Medical History:  Diagnosis Date  . Anemia   . ASCUS with positive high risk HPV   . Pyelonephritis affecting pregnancy in second trimester 03/11/2016  . Vaginal Pap smear, abnormal     Past Surgical History:  Procedure Laterality Date  . COLPOSCOPY    . cyst removal arm      Family History  Problem Relation Age of Onset  . Hypertension Mother   . Hypertension Father     Social History   Tobacco Use  . Smoking status: Never Smoker  . Smokeless tobacco: Never Used  Vaping Use  . Vaping Use: Never used  Substance Use Topics  . Alcohol use: No  . Drug use: No    Allergies: No Known Allergies  No medications prior to admission.    Review of Systems  Constitutional: Negative.   Gastrointestinal: Positive for abdominal pain. Negative for diarrhea, nausea and vomiting.  Genitourinary: Positive for vaginal bleeding. Negative for dysuria and vaginal discharge.   Physical Exam   Blood pressure 128/75, pulse 86, temperature 98.8 F (37.1 C), temperature source Oral, resp. rate 18, weight 93.5 kg, last menstrual period 10/18/2020, SpO2 100 %, unknown if currently  breastfeeding.  Physical Exam Vitals and nursing note reviewed.  Constitutional:      General: She is not in acute distress.    Appearance: She is well-developed. She is obese.  HENT:     Head: Normocephalic and atraumatic.  Pulmonary:     Effort: Pulmonary effort is normal. No respiratory distress.  Abdominal:     Tenderness: There is no abdominal tenderness.  Skin:    General: Skin is warm and dry.  Neurological:     Mental Status: She is alert.  Psychiatric:        Mood and Affect: Mood normal.        Behavior: Behavior normal.     MAU Course  Procedures Results for orders placed or performed during the hospital encounter of 12/15/20 (from the past 24 hour(s))  Pregnancy, urine POC     Status: Abnormal   Collection Time: 12/15/20 10:38 AM  Result Value Ref Range   Preg Test, Ur POSITIVE (A) NEGATIVE  Urinalysis, Routine w reflex microscopic     Status: Abnormal   Collection Time: 12/15/20 10:40 AM  Result Value Ref Range   Color, Urine YELLOW YELLOW   APPearance CLEAR CLEAR   Specific Gravity, Urine  1.020 1.005 - 1.030   pH 7.0 5.0 - 8.0   Glucose, UA NEGATIVE NEGATIVE mg/dL   Hgb urine dipstick SMALL (A) NEGATIVE   Bilirubin Urine NEGATIVE NEGATIVE   Ketones, ur NEGATIVE NEGATIVE mg/dL   Protein, ur NEGATIVE NEGATIVE mg/dL   Nitrite NEGATIVE NEGATIVE   Leukocytes,Ua NEGATIVE NEGATIVE   RBC / HPF 6-10 0 - 5 RBC/hpf   WBC, UA 0-5 0 - 5 WBC/hpf   Bacteria, UA NONE SEEN NONE SEEN   Squamous Epithelial / LPF 0-5 0 - 5   Mucus PRESENT   CBC     Status: Abnormal   Collection Time: 12/15/20 11:35 AM  Result Value Ref Range   WBC 7.5 4.0 - 10.5 K/uL   RBC 4.14 3.87 - 5.11 MIL/uL   Hemoglobin 10.5 (L) 12.0 - 15.0 g/dL   HCT 29.9 (L) 37.1 - 69.6 %   MCV 80.7 80.0 - 100.0 fL   MCH 25.4 (L) 26.0 - 34.0 pg   MCHC 31.4 30.0 - 36.0 g/dL   RDW 78.9 (H) 38.1 - 01.7 %   Platelets 319 150 - 400 K/uL   nRBC 0.0 0.0 - 0.2 %  hCG, quantitative, pregnancy     Status:  Abnormal   Collection Time: 12/15/20 11:35 AM  Result Value Ref Range   hCG, Beta Chain, Quant, S 9,312 (H) <5 mIU/mL   US OB LESS THAN 14 WEEKS WITH OB TRANSVAGINAL  Result Date: 12/15/2020 CLINICAL DATA:  Vaginal bleeding.  First trimester of pregnancy. EXAM: OBSTETRIC <14 WK [place] AND TRANSVAGINAL OB US TECHNIQUE: Both transabdominal and transvaginal ultrasound examinations were performed for complete evaluation of the gestation as well as the maternal uterus, adnexal regions, and pelvic cul-de-sac. Transvaginal technique was performed to assess early pregnancy. COMPARISON:  March 06, 2019. FINDINGS: Intrauterine gestational sac: Single Yolk sac:  Visualized. Embryo:  Not Visualized. Cardiac Activity: Not Visualized. MSD: 17 mm   6 w   3 d Subchorionic hemorrhage:  None visualized. Maternal uterus/adnexae: No free fluid is noted. Right ovary appears normal. Left ovary is not visualized. IMPRESSION: Probable early intrauterine gestational sac with yolk sac, but no fetal pole or cardiac activity yet visualized. Recommend follow-up quantitative B-HCG levels and follow-up US in 14 days to assess viability. This recommendation follows SRU consensus guidelines: Diagnostic Criteria for Nonviable Pregnancy Early in the First Trimester. Malva Limes Med 2013; 510:2585-27. Electronically Signed   By: Lupita Raider M.D.   On: 12/15/2020 13:58    MDM +UPT UA, CBC, ABO/Rh, quant hCG, and [place] today to rule out ectopic pregnancy which can be life threatening.   RH positive   Ultrasound shows IUGS with yolk sac. Either early pregnancy or failed pregnancy due to size/dates discrepancy. Will get viability scan in 10-14 days.    Assessment and Plan   1. Threatened miscarriage   2. Vaginal bleeding in pregnancy, first trimester   3. Normal IUP (intrauterine pregnancy) on prenatal ultrasound, first trimester    -bleeding precautions -outpatient viability scan ordered   Judeth Horn 12/15/2020, 3:25 PM

## 2020-12-15 NOTE — MAU Provider Note (Signed)
History   [id number]  Arrival date and time: 12/15/20 1006   Chief Complaint  Patient presents with  . Vaginal Bleeding  . Abdominal Pain  . Possible Pregnancy    HPI Karen Burke is a 35 y.o. at [redacted]w[redacted]d by LMP who presents for vaginal bleeding and lower abdominal cramping. Patient reports positive home pregnancy test but has not received any prenatal care yet. She noticed vaginal bleeding that started 6 hrs ago. Described as a small amount on the toilet paper, without clots. She is not wearing a pad. Denies any other symptoms at this time. Hx of 2 prior miscarriages and wonders if she's having another one.  Review of outside prenatal records: none Review of last MAU visit on 03/09/2019: decreasing quant c/w SAB Review of records from Care Everywhere: no pertinent records    OB History    Gravida  5   Para  2   Term  2   Preterm      AB  2   Living  2     SAB  1   IAB      Ectopic      Multiple  0   Live Births  2           Past Medical History:  Diagnosis Date  . Anemia   . ASCUS with positive high risk HPV   . Pyelonephritis affecting pregnancy in second trimester 03/11/2016  . Vaginal Pap smear, abnormal     Past Surgical History:  Procedure Laterality Date  . COLPOSCOPY    . cyst removal arm      Family History  Problem Relation Age of Onset  . Hypertension Mother   . Hypertension Father     Social History   Socioeconomic History  . Marital status: Married    Spouse name: Not on file  . Number of children: Not on file  . Years of education: Not on file  . Highest education level: Not on file  Occupational History  . Not on file  Tobacco Use  . Smoking status: Never Smoker  . Smokeless tobacco: Never Used  Vaping Use  . Vaping Use: Never used  Substance and Sexual Activity  . Alcohol use: No  . Drug use: No  . Sexual activity: Yes    Birth control/protection: None  Other Topics Concern  . Not on file  Social History Narrative   . Not on file   Social Determinants of Health   Financial Resource Strain: Not on file  Food Insecurity: Not on file  Transportation Needs: Not on file  Physical Activity: Not on file  Stress: Not on file  Social Connections: Not on file  Intimate Partner Violence: Not on file    No Known Allergies  No current facility-administered medications on file prior to encounter.   Current Outpatient Medications on File Prior to Encounter  Medication Sig Dispense Refill  . Prenatal Vit-Fe Fumarate-FA (PRENATAL MULTIVITAMIN) TABS tablet Take 1 tablet by mouth daily at 12 noon. (Patient not taking: Reported on 07/13/2016) 90 tablet 3  . promethazine (PHENERGAN) 25 MG tablet Take 0.5 tablets (12.5 mg total) by mouth every 6 (six) hours as needed for nausea or vomiting. (Patient not taking: Reported on 12/24/2019) 30 tablet 0     ROS Pertinent positives and negative per HPI, all others reviewed and negative  Physical Exam   BP (!) 143/89 (BP Location: Right Arm)   Pulse 89   Temp 98 F (36.7 C) (Oral)  Resp 18   Wt 93.5 kg   LMP 10/18/2020   SpO2 99%   BMI 40.25 kg/m   Physical Exam Gen: alert, well-appearing, NAD Resp: breathing comfortably  Labs Results for orders placed or performed during the hospital encounter of 12/15/20 (from the past 24 hour(s))  Pregnancy, urine POC     Status: Abnormal   Collection Time: 12/15/20 10:38 AM  Result Value Ref Range   Preg Test, Ur POSITIVE (A) NEGATIVE  Urinalysis, Routine w reflex microscopic     Status: Abnormal   Collection Time: 12/15/20 10:40 AM  Result Value Ref Range   Color, Urine YELLOW YELLOW   APPearance CLEAR CLEAR   Specific Gravity, Urine 1.020 1.005 - 1.030   pH 7.0 5.0 - 8.0   Glucose, UA NEGATIVE NEGATIVE mg/dL   Hgb urine dipstick SMALL (A) NEGATIVE   Bilirubin Urine NEGATIVE NEGATIVE   Ketones, ur NEGATIVE NEGATIVE mg/dL   Protein, ur NEGATIVE NEGATIVE mg/dL   Nitrite NEGATIVE NEGATIVE   Leukocytes,Ua  NEGATIVE NEGATIVE   RBC / HPF 6-10 0 - 5 RBC/hpf   WBC, UA 0-5 0 - 5 WBC/hpf   Bacteria, UA NONE SEEN NONE SEEN   Squamous Epithelial / LPF 0-5 0 - 5   Mucus PRESENT   CBC     Status: Abnormal   Collection Time: 12/15/20 11:35 AM  Result Value Ref Range   WBC 7.5 4.0 - 10.5 K/uL   RBC 4.14 3.87 - 5.11 MIL/uL   Hemoglobin 10.5 (L) 12.0 - 15.0 g/dL   HCT 73.2 (L) 20.2 - 54.2 %   MCV 80.7 80.0 - 100.0 fL   MCH 25.4 (L) 26.0 - 34.0 pg   MCHC 31.4 30.0 - 36.0 g/dL   RDW 70.6 (H) 23.7 - 62.8 %   Platelets 319 150 - 400 K/uL   nRBC 0.0 0.0 - 0.2 %  hCG, quantitative, pregnancy     Status: Abnormal   Collection Time: 12/15/20 11:35 AM  Result Value Ref Range   hCG, Beta Chain, Quant, S 9,312 (H) <5 mIU/mL    Imaging US OB LESS THAN 14 WEEKS WITH OB TRANSVAGINAL  Result Date: 12/15/2020 CLINICAL DATA:  Vaginal bleeding.  First trimester of pregnancy. EXAM: OBSTETRIC <14 WK [place] AND TRANSVAGINAL OB US TECHNIQUE: Both transabdominal and transvaginal ultrasound examinations were performed for complete evaluation of the gestation as well as the maternal uterus, adnexal regions, and pelvic cul-de-sac. Transvaginal technique was performed to assess early pregnancy. COMPARISON:  March 06, 2019. FINDINGS: Intrauterine gestational sac: Single Yolk sac:  Visualized. Embryo:  Not Visualized. Cardiac Activity: Not Visualized. MSD: 17 mm   6 w   3 d Subchorionic hemorrhage:  None visualized. Maternal uterus/adnexae: No free fluid is noted. Right ovary appears normal. Left ovary is not visualized. IMPRESSION: Probable early intrauterine gestational sac with yolk sac, but no fetal pole or cardiac activity yet visualized. Recommend follow-up quantitative B-HCG levels and follow-up US in 14 days to assess viability. This recommendation follows SRU consensus guidelines: Diagnostic Criteria for Nonviable Pregnancy Early in the First Trimester. Malva Limes Med 2013; 315:1761-60. Electronically Signed   By: Lupita Raider  M.D.   On: 12/15/2020 13:58    MAU Course  Procedures  Lab Orders     CBC     hCG, quantitative, pregnancy     Urinalysis, Routine w reflex microscopic     Pregnancy, urine POC Meds ordered this encounter  Medications  . acetaminophen (TYLENOL) tablet 1,000  mg    Imaging Orders     US OB LESS THAN 14 WEEKS WITH OB TRANSVAGINAL   Assessment and Plan  Karen Burke is a 35 y.o. [id number] who presents with scant vaginal bleeding and abdominal cramping. Patient is currently [redacted]w[redacted]d by LMP, with pregnancy of unknown location.  Differential includes early IUP vs SAB vs ectopic. Will check bHCG, CBC, and ultrasound. Patient is Rh positive.  CBC unremarkable bHCG 9,312 Ultrasound reveals intrauterine gestational sac with yolk sac. No fetal pole or cardiac activity seen-- unable to differentiate early IUP vs. SAB.  Discussed results with patient. All questions answered.  Patient discharged home in stable condition. She will follow-up with her OBGYN at [facility].   Maury Dus

## 2020-12-15 NOTE — Discharge Instructions (Signed)
You can take 650mg  of Tylenol every 6 hours as needed for pain    Return to care   If you have heavier bleeding that soaks through more that 2 pads per hour for an hour or more  If you bleed so much that you feel like you might pass out or you do pass out  If you have significant abdominal pain that is not improved with Tylenol       Vaginal Bleeding During Pregnancy, First Trimester A small amount of bleeding from the vagina, or spotting, is common during early pregnancy. Some bleeding may be related to the pregnancy, and some may not. In many cases, the bleeding is normal and is not a problem. However, bleeding can also be a sign of something serious. Normal things that may cause bleeding during the first trimester:  Implantation of the fertilized egg in the lining of the uterus.  Rapid changes in blood vessels. This is caused by changes that are happening to the body during pregnancy.  Sex.  Pelvic exams. Abnormal things that may cause bleeding during the first trimester include:  Infection or inflammation of the cervix.  Growths or polyps on the cervix.  Miscarriage or threatened miscarriage.  Pregnancy that is growing outside of the uterus (ectopic pregnancy).  A fertilized egg that becomes a mass of tissue (molar pregnancy). Tell your health care provider right away if there is any bleeding from your vagina. Follow these instructions at home: Monitoring your bleeding Monitor your bleeding.  Pay attention to any changes in your symptoms. Let your health care provider know about any concerns.  Try to understand when the bleeding occurs. Does the bleeding start on its own, or does it start after something is done, such as sex or a pelvic exam?  Use a diary to record the things you see about your bleeding, including: ? The kind of bleeding you are having. Does the bleeding start and stop irregularly, or is it a constant flow? ? The severity of your bleeding. Is the  bleeding heavy or light? ? The number of pads you use each day, how often you change them, and how soaked they are.  Tell your health care provider if you pass tissue. He or she may want to see it.   Activity  Follow instructions from your health care provider about limiting your activity. Ask what activities are safe for you.  Do not have sex until your health care provider says that this is safe.  If needed, make plans for someone to help with your regular activities. General instructions  Take over-the-counter and prescription medicines only as told by your health care provider.  Do not take aspirin because it can cause bleeding.  Do not use tampons or douche.  Keep all follow-up visits. This is important. Contact a health care provider if:  You have vaginal bleeding during any part of your pregnancy.  You have cramps or labor pains.  You have a fever or chills. Get help right away if:  You have severe cramps in your back or abdomen.  You pass large clots or a large amount of tissue from your vagina.  Your bleeding increases.  You feel light-headed or weak, or you faint.  You are leaking fluid or have a gush of fluid from your vagina. Summary  A small amount of bleeding from the vagina is common during early pregnancy.  Be sure to tell your health care provider about any vaginal bleeding right away.  Try to understand when bleeding occurs. Does bleeding occur on its own, or does it occur after something is done, such as sex or pelvic exams?  Keep all follow-up visits. This is important. This information is not intended to replace advice given to you by your health care provider. Make sure you discuss any questions you have with your health care provider. Document Revised: 06/12/2020 Document Reviewed: 06/12/2020 Elsevier Patient Education  2021 [employer].

## 2020-12-15 NOTE — MAU Note (Signed)
Is pregnant.  Started bleeding and cramping today. Has not had preg confirmed yet.

## 2020-12-22 ENCOUNTER — Encounter (HOSPITAL_COMMUNITY): Payer: Self-pay | Admitting: Obstetrics and Gynecology

## 2020-12-22 ENCOUNTER — Inpatient Hospital Stay (HOSPITAL_COMMUNITY)
Admission: AD | Admit: 2020-12-22 | Discharge: 2020-12-22 | Disposition: A | Discharge status: Home / Self Care | Payer: Managed Care, Other (non HMO) | Attending: Family Medicine | Admitting: Family Medicine

## 2020-12-22 ENCOUNTER — Other Ambulatory Visit: Payer: Self-pay

## 2020-12-22 ENCOUNTER — Inpatient Hospital Stay (HOSPITAL_COMMUNITY): Payer: Managed Care, Other (non HMO)

## 2020-12-22 DIAGNOSIS — M549 Dorsalgia, unspecified: Secondary | ICD-10-CM | POA: Insufficient documentation

## 2020-12-22 DIAGNOSIS — Z79899 Other long term (current) drug therapy: Secondary | ICD-10-CM | POA: Insufficient documentation

## 2020-12-22 DIAGNOSIS — Z3A09 9 weeks gestation of pregnancy: Secondary | ICD-10-CM | POA: Diagnosis not present

## 2020-12-22 DIAGNOSIS — O039 Complete or unspecified spontaneous abortion without complication: Secondary | ICD-10-CM | POA: Insufficient documentation

## 2020-12-22 DIAGNOSIS — R109 Unspecified abdominal pain: Secondary | ICD-10-CM | POA: Insufficient documentation

## 2020-12-22 DIAGNOSIS — O209 Hemorrhage in early pregnancy, unspecified: Secondary | ICD-10-CM

## 2020-12-22 DIAGNOSIS — O26891 Other specified pregnancy related conditions, first trimester: Secondary | ICD-10-CM | POA: Insufficient documentation

## 2020-12-22 LAB — URINALYSIS, ROUTINE W REFLEX MICROSCOPIC
Bilirubin Urine: NEGATIVE
Glucose, UA: NEGATIVE mg/dL
Ketones, ur: NEGATIVE mg/dL
Nitrite: NEGATIVE
Protein, ur: 30 mg/dL — AB
Specific Gravity, Urine: 1.02 (ref 1.005–1.030)
pH: 7.5 (ref 5.0–8.0)

## 2020-12-22 LAB — URINALYSIS, MICROSCOPIC (REFLEX): RBC / HPF: >50 RBC/hpf (ref 0–5)

## 2020-12-22 MED ORDER — IBUPROFEN 600 MG PO TABS: 600.0000 mg | ORAL_TABLET | Freq: Four times a day (QID) | ORAL | 3 refills | Status: DC | PRN

## 2020-12-22 NOTE — MAU Provider Note (Addendum)
Chief Complaint:  Abdominal Pain, Back Pain, and Vaginal Bleeding   Event Date/Time   First Provider Initiated Contact with Patient 12/22/20 1039     HPI: Karen Burke is a 35 y.o. [id number] at [redacted]w[redacted]d who presents to maternity admissions reporting heavy vaginal bleeding with clots and lower abdominal cramping. She was seen for this problem on 12/15/20; a IUGS/YS was seen on ultrasound but with size/dating discrepancy. At the time she was only having pink spotting which has progressed to dark red bleeding, multiple clots and increased pain. She is changing pads 5x/day, took Tylenol prior to coming which brought her pain from 10/10 to 5/10. Has a picture showing a clot suspicious for POC, but is concerned about the continued bleeding/clotting. Denies lightheadedness, dizziness, headache or any other physical symptoms. Of note - also denies BP issues, but several in MAU have been elevated. Discussed with FOB, he states she needs a new PCP at the same office but they will follow up.  Pregnancy Course: Has not established care here yet, will do SAB follow up at [facility] PCP is at [facility].  Uses husband for interpretation, declined our medical interpretation services to RN and CNM.  Past Medical History:  Diagnosis Date  . Anemia   . ASCUS with positive high risk HPV   . Pyelonephritis affecting pregnancy in second trimester 03/11/2016  . Vaginal Pap smear, abnormal    OB History  Gravida Para Term Preterm AB Living  5 2 2   2 2   SAB IAB Ectopic Multiple Live Births  1     0 2    # Outcome Date GA Lbr Len/2nd Weight Sex Delivery Anes PTL Lv  5 Current           4 SAB 2021          3 AB 2019          2 Term 05/31/16 [redacted]w[redacted]d 20:46 / 00:45 7 lb 12.3 oz (3.525 kg) M Vag-Spont None  LIV  1 Term 06/17/15 [redacted]w[redacted]d 24:51 / 02:35 6 lb 7.4 oz (2.93 kg) F Vag-Spont EPI  LIV   Past Surgical History:  Procedure Laterality Date  . COLPOSCOPY    . cyst removal arm     Family History  Problem Relation Age of  Onset  . Hypertension Mother    Social History   Tobacco Use  . Smoking status: Never Smoker  . Smokeless tobacco: Never Used  Vaping Use  . Vaping Use: Never used  Substance Use Topics  . Alcohol use: No  . Drug use: No   No Known Allergies Medications Prior to Admission  Medication Sig Dispense Refill Last Dose  . acetaminophen (TYLENOL) 325 MG tablet Take 650 mg by mouth every 6 (six) hours as needed.   12/22/2020 at Unknown time  . ibuprofen (ADVIL) 400 MG tablet Take 400 mg by mouth every 6 (six) hours as needed.   Past Week at Unknown time  . Prenatal Vit-Fe Fumarate-FA (PRENATAL MULTIVITAMIN) TABS tablet Take 1 tablet by mouth daily at 12 noon. (Patient not taking: No sig reported) 90 tablet 3    I have reviewed patient's Past Medical Hx, Surgical Hx, Family Hx, Social Hx, medications and allergies.   ROS:  Review of Systems  Constitutional: Negative for fatigue and fever.  Eyes: Negative for visual disturbance.  Respiratory: Negative for shortness of breath.   Gastrointestinal: Positive for abdominal pain. Negative for nausea and vomiting.  Genitourinary: Positive for vaginal bleeding. Negative for  dysuria, frequency and urgency.  Neurological: Negative for dizziness, syncope, light-headedness and headaches.  All other systems reviewed and are negative.   Physical Exam   Patient Vitals for the past 24 hrs:  BP Temp Temp src Pulse Resp SpO2 Height Weight  12/22/20 1034 (!) 156/96 - - 86 - - - -  12/22/20 1013 (!) 148/102 98 F (36.7 C) Oral 88 18 99 % - -  12/22/20 1006 - - - - - - 5' (1.524 m) 204 lb 14.4 oz (92.9 kg)   Constitutional: Well-developed, well-nourished female in no acute distress.  Cardiovascular: normal rate & rhythm, no murmur Respiratory: normal effort, lung sounds clear throughout GI: Abd soft, non-tender, gravid appropriate for gestational age. Pos BS x 4 MS: Extremities nontender, no edema, normal ROM Neurologic: Alert and oriented x 4.  GU:  no CVA tenderness Pelvic exam deferred - TVUS ordered   Labs: Results for orders placed or performed during the hospital encounter of 12/22/20 (from the past 24 hour(s))  Urinalysis, Routine w reflex microscopic Urine, Clean Catch     Status: Abnormal   Collection Time: 12/22/20 10:20 AM  Result Value Ref Range   Color, Urine ORANGE (A) YELLOW   APPearance CLEAR CLEAR   Specific Gravity, Urine 1.020 1.005 - 1.030   pH 7.5 5.0 - 8.0   Glucose, UA NEGATIVE NEGATIVE mg/dL   Hgb urine dipstick LARGE (A) NEGATIVE   Bilirubin Urine NEGATIVE NEGATIVE   Ketones, ur NEGATIVE NEGATIVE mg/dL   Protein, ur 30 (A) NEGATIVE mg/dL   Nitrite NEGATIVE NEGATIVE   Leukocytes,Ua TRACE (A) NEGATIVE  Urinalysis, Microscopic (reflex)     Status: Abnormal   Collection Time: 12/22/20 10:20 AM  Result Value Ref Range   RBC / HPF >50 0 - 5 RBC/hpf   WBC, UA 6-10 0 - 5 WBC/hpf   Bacteria, UA FEW (A) NONE SEEN   Squamous Epithelial / LPF 6-10 0 - 5   Imaging:  US OB Transvaginal  Result Date: 12/22/2020 CLINICAL DATA:  Vaginal bleeding EXAM: TRANSVAGINAL OB ULTRASOUND TECHNIQUE: Transvaginal ultrasound was performed for complete evaluation of the gestation as well as the maternal uterus, adnexal regions, and pelvic cul-de-sac. COMPARISON:  December 15, 2020 FINDINGS: Intrauterine gestational sac: Not visualized Yolk sac:  Not visualized Embryo:  Not visualized Cardiac Activity: Not visualized Subchorionic hemorrhage:  None visualized. Maternal uterus/adnexae: Cervical os closed. Right ovary measures 2.1 x 1.8 x 1.4 cm. Left ovary measures 1.8 x 1.2 x 1.1 cm. No extrauterine pelvic mass. No free fluid. IMPRESSION: Gestational sac no longer evident. Currently there is no findings to indicate intrauterine gestation. Findings are felt to be indicative of recent spontaneous abortion. Correlation with beta hCG advised; beta HCG should be followed to 0 in this circumstance. Study otherwise unremarkable. Electronically Signed    By: Bretta Bang III M.D.   On: 12/22/2020 11:52   MAU Course: Orders Placed This Encounter  Procedures  . US OB Transvaginal  . Urinalysis, Routine w reflex microscopic Urine, Clean Catch  . Beta hCG quant (ref lab)  . Urinalysis, Microscopic (reflex)  . Discharge patient   Meds ordered this encounter  Medications  . ibuprofen (ADVIL) 600 MG tablet    Sig: Take 1 tablet (600 mg total) by mouth every 6 (six) hours as needed.    Dispense:  60 tablet    Refill:  3    Order Specific Question:   Supervising Provider    Answer:   Reva Bores [  [id number]]   MDM: Repeat TVUS and bHCG for comparison ordered, SAB confirmed. No additional POC seen on U/S, no need for cytotec. Reviewed presentation and findings with Dr. Adrian Blackwater.  Explained expected course of bleeding/cramping, encouraged pt to take ibuprofen for cramping. FOB concerned about recurrent pregnancy losses, encouraged he and pt to get her in with PCP for management of HTN and advised we can do bloodwork at her 2wk follow up.  Assessment: 1. Spontaneous miscarriage   2. Vaginal bleeding affecting early pregnancy    Plan: Discharge home in stable condition with bleeding precautions.  Message sent to [facility] admin for scheduling assistance.    Follow-up Information    [facility]. Go in 1 week(s).   Specialty: Obstetrics and Gynecology Why: 1 week for repeat HCG, 2 weeks for provider visit - they will call you to schedule Contact information: [address] [place] [address] [phone]              Allergies as of 12/22/2020   No Known Allergies     Medication List    STOP taking these medications   acetaminophen 325 MG tablet Commonly known as: TYLENOL     TAKE these medications   ibuprofen 600 MG tablet Commonly known as: ADVIL Take 1 tablet (600 mg total) by mouth every 6 (six) hours as needed. What changed:   medication  strength  how much to take   prenatal multivitamin Tabs tablet Take 1 tablet by mouth daily at 12 noon.      Edd Arbour, CNM, MSN, IBCLC Certified Nurse Midwife, [facility]

## 2020-12-22 NOTE — Discharge Instructions (Signed)
Miscarriage A miscarriage is the loss of pregnancy before the 20th week. Most miscarriages happen during the first 3 months of pregnancy. Sometimes, a miscarriage can happen before a woman knows that she is pregnant. Having a miscarriage can be an emotional experience. If you have had a miscarriage, talk with your health care provider about any questions you may have about the loss of your baby, the grieving process, and your plans for future pregnancy. What are the causes? Many times, the cause of a miscarriage is not known. What increases the risk? The following factors may make a pregnant woman more likely to have a miscarriage: Certain medical conditions  Conditions that affect the hormone balance in the body, such as thyroid disease or polycystic ovary syndrome.  Diabetes.  Autoimmune disorders.  Infections.  Bleeding disorders.  Obesity. Lifestyle factors  Using products with tobacco or nicotine in them or being exposed to tobacco smoke.  Having alcohol.  Having large amounts of caffeine.  Recreational drug use. Problems with reproductive organs or structures  Cervical insufficiency. This is when the lowest part of the uterus (cervix) opens and thins before pregnancy is at term.  Having a condition called Asherman syndrome. This syndrome causes scarring in the uterus or causes the uterus to be abnormal in structure.  Fibrous growths, called fibroids, in the uterus.  Congenital abnormalities. These problems are present at birth.  Infection of the cervix or uterus. Personal or medical history  Injury (trauma).  Having had a miscarriage before.  Being younger than age 18 or older than age 35.  Exposure to harmful substances in the environment. This may include radiation or heavy metals, such as lead.  Use of certain medicines. What are the signs or symptoms? Symptoms of this condition include:  Vaginal bleeding or spotting, with or without cramps or  pain.  Pain or cramping in the abdomen or lower back.  Fluid or tissue coming out of the vagina. How is this diagnosed? This condition may be diagnosed based on:  A physical exam.  Ultrasound.  Lab tests, such as blood tests, urine tests, or swabs for infection. How is this treated? Treatment for a miscarriage is sometimes not needed if all the pregnancy tissue that was in the uterus comes out on its own, and there are no other problems such as infection or heavy bleeding. In other cases, this condition may be treated with:  Dilation and curettage (D&C). In this procedure, the cervix is stretched open and any remaining pregnancy tissue is removed from the lining of the uterus (endometrium).  Medicines. These may include: ? Antibiotic medicine, to treat infection. ? Medicine to help any remaining pregnancy tissue come out of the body. ? Medicine to reduce (contract) the size of the uterus. These medicines may be given if there is a lot of bleeding. If you have Rh-negative blood, you may be given an injection of a medicine called Rho(D) immune globulin. This medicine helps prevent problems with future pregnancies. Follow these instructions at home: Medicines  Take over-the-counter and prescription medicines only as told by your health care provider.  If you were prescribed antibiotic medicine, take it as told by your health care provider. Do not stop taking the antibiotic even if you start to feel better. Activity  Rest as told by your health care provider. Ask your health care provider what activities are safe for you.  Have someone help with home and family responsibilities during this time. General instructions  Monitor how much tissue   or blood clot material comes out of the vagina.  Do not have sex, douche, or put anything, such as tampons, in your vagina until your health care provider says it is okay.  To help you and your partner with the grieving process, talk with your  health care provider or get counseling.  When you are ready, meet with your health care provider to discuss any important steps you should take for your health. Also, discuss steps you should take to have a healthy pregnancy in the future.  Keep all follow-up visits. This is important.   Where to find more information  The [employer] of Obstetricians and Gynecologists: acog.org  U.S. Department of Health and Cytogeneticist of Women's Health: http://hoffman.com/ Contact a health care provider if:  You have a fever or chills.  There is bad-smelling fluid coming from the vagina.  You have more bleeding instead of less.  Tissue or blood clots come out of your vagina. Get help right away if:  You have severe cramps or pain in your back or abdomen.  Heavy bleeding soaks through 2 large sanitary pads an hour for more than 2 hours.  You become light-headed or weak.  You faint.  You feel sad, and your sadness takes over your thoughts.  You think about hurting yourself. If you ever feel like you may hurt yourself or others, or have thoughts about taking your own life, get help right away. Go to your nearest emergency department or:  Call your local emergency services (911 in the [place]  Call a suicide crisis helpline, such as the National Suicide Prevention Lifeline at 843 771 1403. This is open 24 hours a day in the [place]  Text the Crisis Text Line at 206-873-3609 (in the [place].). Summary  Most miscarriages happen in the first 3 months of pregnancy. Sometimes miscarriage happens before a woman knows that she is pregnant.  Follow instructions from your health care provider about medicines and activity.  To help you and your partner with grieving, talk with your health care provider or get counseling.  Keep all follow-up visits. This information is not intended to replace advice given to you by your health care provider. Make sure you discuss any questions you  have with your health care provider. Document Revised: 03/21/2020 Document Reviewed: 03/21/2020 Elsevier Patient Education  2021 Elsevier Inc.   Recurrent Pregnancy Loss Recurrent pregnancy loss is the loss of two or more pregnancies before 20 weeks of pregnancy (gestation). This may also be called recurrent or repeated miscarriage. What are the causes? The cause of this condition is usually not known. What increases the risk? The following factors may make a pregnant woman more likely to have recurrent pregnancy loss:  Being older than age 86.  Having certain medical conditions, such as diabetes, thyroid disease, autoimmune disease, or polycystic ovary syndrome.  Having a condition called Asherman syndrome. This syndrome causes scarring in the uterus or causes the uterus to be abnormal in structure.  Having a blood clotting disorder.  Obesity  Being underweight.  Using products with tobacco or nicotine in them, using recreational drugs, or drinking alcohol during pregnancy. What are the signs or symptoms? Having two or more miscarriages. Symptoms of miscarriage include:  Vaginal bleeding or spotting, with or without cramps or pain.  Pain or cramping in the abdomen or lower back.  Fluid or tissue coming out of the vagina. How is this diagnosed? This condition is diagnosed with:  A physical exam. This will include  a pelvic exam to check the vagina and uterus for possible causes of pregnancy loss.  An ultrasound. This is done to: ? Confirm the pregnancy loss. ? See if the structure of the uterus is normal. ? Check for growths such as polyps or fibroids. You may also have other tests, including:  Blood tests to see if you have a condition that causes recurrent pregnancy loss.  Blood tests to see if your blood clots normally.  Genetic tests of you and your partner.   How is this treated? Treatment for this condition depends on the cause of the pregnancy loss. Possible  treatments include:  Lifestyle changes. These may include weight management and stopping use of substances such as cigarettes, drugs, or alcohol.  Taking medicines to treat underlying causes of pregnancy loss.  Having surgery to correct the abnormality in your uterus.  Having your eggs fertilized outside your uterus (in vitro fertilization). By doing this, a health care provider may be able to select eggs without chromosome abnormalities. Chromosomes are the structures inside cells that contain genes. Follow these instructions at home: Lifestyle  Do not use any products that contain nicotine or tobacco, such as cigarettes, e-cigarettes, and chewing tobacco. If you need help quitting, ask your health care provider.  Do not use drugs or alcohol.  Eat a balanced diet rich in fresh fruits and vegetables, whole grains, low-fat dairy, and lean meats.  Manage any long-term health conditions, such as diabetes or thyroid disease.  Get at least 30 minutes of moderate exercise every day, such as biking or swimming.  If you are overweight, ask your health care provider for support and resources to lose weight.  Find ways to manage stress, such as: ? Journaling. ? Doing yoga or deep breathing. ? Spending time in nature. ? Practicing meditation. General instructions  Take over-the-counter and prescription medicines only as told by your health care provider.  Get support from friends and loved ones. Unexpected pregnancy loss can be a sad and stressful event.  Consider meeting with a counselor, spiritual leader, or a pregnancy loss support group.  Ask your health care provider about taking a folic acid supplement.  Meet with a Dentist as part of genetic testing to understand the results of any testing.  Keep all follow-up visits. This is important. Where to find more information  The [employer] of Obstetricians and Gynecologists: acog.org  U.S. Department of Health and  Cytogeneticist of Women's Health: http://hoffman.com/ Contact a health care provider if:  You have been trying to get pregnant without success.  You are struggling with sadness or depression. Get help right away if:  You have feelings of sadness that take over your thoughts.  You have thoughts of hurting yourself. If you ever feel like you may hurt yourself or others, or have thoughts about taking your own life, get help right away. Go to your nearest emergency department or:  Call your local emergency services (911 in the [place]  Call a suicide crisis helpline, such as the National Suicide Prevention Lifeline at 605-438-8754. This is open 24 hours a day in the [place]  Text the Crisis Text Line at 605-873-7554 (in the [place].). Summary  Recurrent pregnancy loss is the loss of two or more pregnancies before 20 weeks of pregnancy (gestation).  The cause of recurrent pregnancy loss is usually not known.  Get support from friends and loved ones. Unexpected pregnancy loss can be a sad and stressful event. This information is not  intended to replace advice given to you by your health care provider. Make sure you discuss any questions you have with your health care provider. Document Revised: 03/21/2020 Document Reviewed: 03/21/2020 Elsevier Patient Education  2021 [employer].

## 2020-12-22 NOTE — MAU Note (Signed)
Reports increase in pain and bleeding since last wk.  Passing clots, changing 5/a day. Took some Tylenol this morning, it helped, pain was "10" .

## 2020-12-22 NOTE — MAU Note (Addendum)
Presents with c/o lower back and abdominal pain and VB that began 1 week ago.  Reports seen last week for same but pain has increased.  States is not saturating a Scientist, research (medical).

## 2020-12-23 ENCOUNTER — Telehealth: Payer: Self-pay | Admitting: General Practice

## 2020-12-23 LAB — BETA HCG QUANT (REF LAB): hCG Quant: 133 m[IU]/mL

## 2020-12-23 NOTE — Telephone Encounter (Signed)
-----   Message from Bernerd Limbo, [place] sent at 12/22/2020 12:56 PM EDT ----- Regarding: SAB Follow Up Please call to get pt scheduled for a 1wk repeat HCG and 2wk F/U with an MD (she has recurrent pregnancy loss). Pt speaks a little English, but may be quicker to call husband for scheduling. Thank you!

## 2020-12-23 NOTE — Telephone Encounter (Signed)
Left message on VM informing patient of appointment scheduled on Monday, 12/29/2020 at 10:00am for labs and with provider on 01/09/2021 at 11:00am.  Asked patient to contact office to confirm.

## 2020-12-29 ENCOUNTER — Other Ambulatory Visit: Payer: Self-pay

## 2020-12-29 ENCOUNTER — Other Ambulatory Visit: Payer: Managed Care, Other (non HMO)

## 2020-12-29 DIAGNOSIS — O209 Hemorrhage in early pregnancy, unspecified: Secondary | ICD-10-CM

## 2020-12-29 NOTE — Progress Notes (Signed)
Patient sent to lab for hcg follow up. Was seen in MAU for bleeding in early pregnancy. Armandina Stammer RN

## 2020-12-30 LAB — BETA HCG QUANT (REF LAB): hCG Quant: 9 m[IU]/mL

## 2021-01-02 ENCOUNTER — Telehealth: Payer: Self-pay

## 2021-01-02 NOTE — Telephone Encounter (Signed)
-----   Message from Levie Heritage, DO sent at 12/30/2020 11:02 AM EDT ----- HCG negative. Completed miscarriage. Please let pt know.

## 2021-01-02 NOTE — Telephone Encounter (Signed)
Attempted to reach patient to make her aware of results. Armandina Stammer RN

## 2021-01-09 ENCOUNTER — Ambulatory Visit: Payer: Managed Care, Other (non HMO) | Admitting: Obstetrics and Gynecology

## 2021-10-04 NOTE — L&D Delivery Note (Cosign Needed)
OB/GYN Faculty Practice Delivery Note  Karen Burke is a 36 y.o. [id number] s/p VD at [redacted]w[redacted]d. She was admitted for IOL due to pre-eclampsia with severe features.   ROM: 2h 37m with clear fluid GBS Status: unknown, PCN  Maximum Maternal Temperature: 98.82F  Labor Progress: Initial SVE: 1/thick/posterior. She then progressed to complete.   Delivery Date/Time: [phone] Delivery: Called to room and patient was almost complete. Performed AROM of remaining forebag and then patient felt more pressure with subsequent pushing. Head delivered L OA. No nuchal cord present. Shoulder and body delivered in usual fashion. Infant with poor tone initially, placed on mother's abdomen, dried and stimulated with cry. Cord clamped x 2 after 1-minute delay, and cut by FOB. Cord blood drawn. Placenta delivered spontaneously with gentle cord traction. TXA was given. Fundus and lower uterine segment remained boggy with massage, lower uterine sweep, and Pitocin. She then had brisk bleeding (~600 cc quickly) so a JADA was placed intrauterine to suction. Cytotec 800 mcg was also given. Labia, perineum, vagina, and cervix inspected with a small first degree laceration that was approximated with 3.0 Monocryl.   Baby Weight: 2610g  Placenta: 3 vessel, intact. Sent to L&D Complications: Postpartum hemorrhage (due to uterine atony)  Lacerations: 1st degree  EBL: 1,300 mL Analgesia: IV fentanyl post-delivery   Infant:  APGAR (1 MIN): 7   APGAR (5 MINS): 9    Leticia Penna, DO  OB Family Medicine Fellow, [facility] for [facility], [facility] 03/09/2022, 6:40 AM

## 2021-10-13 ENCOUNTER — Other Ambulatory Visit (HOSPITAL_COMMUNITY)
Admission: RE | Admit: 2021-10-13 | Discharge: 2021-10-13 | Disposition: A | Discharge status: Home / Self Care | Payer: Managed Care, Other (non HMO) | Source: Ambulatory Visit

## 2021-10-13 ENCOUNTER — Other Ambulatory Visit: Payer: Self-pay

## 2021-10-13 ENCOUNTER — Ambulatory Visit (INDEPENDENT_AMBULATORY_CARE_PROVIDER_SITE_OTHER): Payer: Managed Care, Other (non HMO)

## 2021-10-13 VITALS — BP 129/80 | HR 103 | Wt 203.0 lb

## 2021-10-13 DIAGNOSIS — Z789 Other specified health status: Secondary | ICD-10-CM | POA: Diagnosis not present

## 2021-10-13 DIAGNOSIS — Z3480 Encounter for supervision of other normal pregnancy, unspecified trimester: Secondary | ICD-10-CM

## 2021-10-13 DIAGNOSIS — O99019 Anemia complicating pregnancy, unspecified trimester: Secondary | ICD-10-CM

## 2021-10-13 DIAGNOSIS — Z3A16 16 weeks gestation of pregnancy: Secondary | ICD-10-CM | POA: Diagnosis present

## 2021-10-13 DIAGNOSIS — Z3A14 14 weeks gestation of pregnancy: Secondary | ICD-10-CM | POA: Diagnosis not present

## 2021-10-13 DIAGNOSIS — O99210 Obesity complicating pregnancy, unspecified trimester: Secondary | ICD-10-CM | POA: Diagnosis not present

## 2021-10-13 DIAGNOSIS — R8271 Bacteriuria: Secondary | ICD-10-CM

## 2021-10-13 DIAGNOSIS — O99212 Obesity complicating pregnancy, second trimester: Secondary | ICD-10-CM | POA: Diagnosis not present

## 2021-10-13 DIAGNOSIS — Z758 Other problems related to medical facilities and other health care: Secondary | ICD-10-CM

## 2021-10-13 MED ORDER — ASPIRIN 81 MG PO CHEW: 81.0000 mg | CHEWABLE_TABLET | Freq: Every day | ORAL | 3 refills | Status: DC

## 2021-10-13 MED ORDER — PREPLUS 27-1 MG PO TABS: 1.0000 | ORAL_TABLET | Freq: Every day | ORAL | 3 refills | Status: DC

## 2021-10-13 NOTE — Progress Notes (Signed)
Last pap 12-24-19- WNL. Armandina Stammer RN   DATING AND VIABILITY SONOGRAM   Karen Burke is a 36 y.o. year old [phone] with LMP Patient's last menstrual period was 07/01/2021. which would correlate to  [redacted]w[redacted]d weeks gestation.  She has regular menstrual cycles.   She is here today for a confirmatory initial sonogram.    GESTATION: SINGLETON  FETAL ACTIVITY:          Heart rate         155 bpm          The fetus is active.    GESTATIONAL AGE AND  BIOMETRICS:  Gestational criteria: Estimated Date of Delivery: 04/07/22 by LMP now at [redacted]w[redacted]d  Previous Scans:0  Femur length          1.91 cm         15-5 weeks            2.07 cm         15-6 weeks  HC 11.55 cm 15-5 weeks                                                                           AVERAGE EGA(BY THIS SCAN):  15-5 week  WORKING EDD( LMP ):  04/07/2022     TECHNICIAN COMMENTS:  Patient informed that the ultrasound is considered a limited obstetric ultrasound and is not intended to be a complete ultrasound exam.  Patient also informed that the ultrasound is not being completed with the intent of assessing for fetal or placental anomalies or any pelvic abnormalities. Explained that the purpose of today's ultrasound is to assess for fetal heart rate.  Patient acknowledges the purpose of the exam and the limitations of the study.   Armandina Stammer 10/13/2021 10:05 AM

## 2021-10-13 NOTE — Progress Notes (Signed)
Subjective:   Karen Burke is a 36 y.o. [id number] at [redacted]w[redacted]d by Approximate LMP of Sept 28, 2022 being seen today for her first obstetrical visit. Upon discussion patient states LMP actually started on Sept 20th and ended around the 28th.  This coincides with US performed in office and LMP changed accordingly making GA [redacted] weeks today. Patient states this was a planned pregnancy.  Patient reports she was not on birth control prior to conception.   Gynecological/Obstetrical History: Patient reports no history of gynecological surgeries.  Patient denies a  history of abnormal pap smears.   Pregnancy history fully reviewed. Patient does intend to breast feed. Patient obstetrical history is significant for obesity.   Sexual Activity and Vaginal Concerns: Patient is not currently sexually active.  She also denies vaginal discharge, bleeding, irritation, or odor. Patient also denies pain or difficulty with urination.    Medical History/ROS: Patient denies medical history significant for cardiovascular, respiratory, gastrointestinal, or hematological disorders. She does report sleep apnea. Patient also denies history of MH disorders including anxiety and/or depression.  Patient reports no complaints.  Patient denies constipation/diarrhea or nausea/vomiting.  No recurrent headaches.    Social History: Patient denies history or current usage of tobacco, alcohol, or drugs.  Patient reports the FOB is Karen Burke who is present and supportive.  Patient reports that she lives with Karen Burke, grandmother, and two children and endorses safety at home.  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  HISTORY: OB History  Gravida Para Term Preterm AB Living  6 2 2  0 3 2  SAB IAB Ectopic Multiple Live Births  2 0 0 0 2    # Outcome Date GA Lbr Len/2nd Weight Sex Delivery Anes PTL Lv  6 Current           5 SAB 2021          4 AB 2019          3 Term 05/31/16 [redacted]w[redacted]d 20:46 / 00:45 7  lb 12.3 oz (3.525 kg) M Vag-Spont None  LIV     Name: Karen Burke     Apgar1: 8  Apgar5: 9  2 Term 06/17/15 [redacted]w[redacted]d 24:51 / 02:35 6 lb 7.4 oz (2.93 kg) F Vag-Spont EPI  LIV     Name: Karen Burke     Apgar1: 3  Apgar5: 5  1 SAB             Last pap smear was done March 2021 and was normal  Past Medical History:  Diagnosis Date   Anemia    ASCUS with positive high risk HPV    Pyelonephritis affecting pregnancy in second trimester 03/11/2016   Vaginal Pap smear, abnormal    Past Surgical History:  Procedure Laterality Date   COLPOSCOPY     cyst removal arm     Family History  Problem Relation Age of Onset   Hypertension Mother    Social History   Tobacco Use   Smoking status: Never   Smokeless tobacco: Never  Vaping Use   Vaping Use: Never used  Substance Use Topics   Alcohol use: No   Drug use: No   No Known Allergies Current Outpatient Medications on File Prior to Visit  Medication Sig Dispense Refill   Prenatal Vit-Fe Fumarate-FA (PRENATAL MULTIVITAMIN) TABS tablet Take 1 tablet by mouth daily at 12 noon. 90 tablet 3   No current facility-administered medications on file prior to visit.  Review of Systems Pertinent items noted in HPI and remainder of comprehensive ROS otherwise negative.  Exam   Vitals:   10/13/21 0930  BP: 129/80  Pulse: (!) 103  Weight: 203 lb (92.1 kg)      Physical Exam Constitutional:      Appearance: Normal appearance.  Genitourinary:     Vulva normal.     Genitourinary Comments: BME performed. No CMT Uterus enlarged, but difficult to assess due to abdominal panus. No tenderness in cul de sac.  Ovaries not appreciated.      No cervical motion tenderness.     Uterus is enlarged.  Breasts:    Breasts are soft.    Right: Normal.     Left: Normal.     Breast exam comments: CBE complete Appropriately Tender. HENT:     Head: Normocephalic and atraumatic.  Eyes:     Conjunctiva/sclera: Conjunctivae normal.   Cardiovascular:     Rate and Rhythm: Normal rate.     Heart sounds: Normal heart sounds.  Pulmonary:     Effort: Pulmonary effort is normal.     Breath sounds: Normal breath sounds.  Musculoskeletal:        General: Normal range of motion.  Neurological:     Mental Status: She is alert and oriented to person, place, and time.  Skin:    General: Skin is warm and dry.  Psychiatric:        Mood and Affect: Mood normal.        Behavior: Behavior normal.        Thought Content: Thought content normal.  Vitals reviewed. Exam conducted with a chaperone present.    Assessment:   36 y.o. year old [id number] Patient Active Problem List   Diagnosis Date Noted   Supervision of other normal pregnancy, antepartum 10/13/2021   Obesity in pregnancy 10/13/2021   Language barrier 12/17/2014   Plan:  1. Supervision of other normal pregnancy, antepartum -Congratulations given and patient welcomed to back to practice. -Anticipatory guidance for prenatal visits including labs, ultrasounds, and testing; Initial labs done. -Genetic Screening discussed, Quad screen and NIPS: ordered. -Encouraged to complete and utilize MyChart Registration for her ability to review results, send requests, and have questions addressed.  -Discussed estimated due date of March 30, 2022 based on updated LMP. -Ultrasound discussed; fetal anatomic survey: ordered. -Rx prenatal vitamins sent to pharmacy. -Encouraged to seek out care at office or emergency room for urgent and/or emergent concerns. -Educated on the nature of [facility] with multiple MDs and other Advanced Practice Providers was explained to patient; also emphasized that residents, students are part of our team. Informed of her right to refuse care as she deems appropriate.  -No questions or concerns.    2. Language barrier -Speaks [place] -Understands and answers questions in English -Husband at bedside providing  limited translation  3. [redacted] weeks gestation of pregnancy -Doing well.  4. Obesity in pregnancy -Prepregnancy BMI 37.5 -BMI 39.65 today -Reviewed addition of Hgb A1C to initial labs. -Also reviewed risks factors of BMI>30 and AA Heritage for PreEclampsia.  Discussed research and recommendation of bASA throughout PN period.   -Patient and husband w/o questions. -Rx for bASA sent to pharmacy on file.  -Detailed Anatomy Scan requested     Problem list reviewed and updated. Routine obstetric precautions reviewed.  No orders of the defined types were placed in this encounter.   No follow-ups on file.  Maryann Conners, CNM 10/13/2021 9:47 AM

## 2021-10-14 LAB — CBC/D/PLT+RPR+RH+ABO+RUBIGG...
Antibody Screen: NEGATIVE
Basophils Absolute: 0.1 10*3/uL (ref 0.0–0.2)
Basos: 1 %
EOS (ABSOLUTE): 0.1 10*3/uL (ref 0.0–0.4)
Eos: 1 %
HCV Ab: <0.1 s/co ratio (ref 0.0–0.9)
HIV Screen 4th Generation wRfx: NONREACTIVE
Hematocrit: 30.2 % — ABNORMAL LOW (ref 34.0–46.6)
Hemoglobin: 9.8 g/dL — ABNORMAL LOW (ref 11.1–15.9)
Hepatitis B Surface Ag: NEGATIVE
Immature Grans (Abs): 0 10*3/uL (ref 0.0–0.1)
Immature Granulocytes: 0 %
Lymphocytes Absolute: 1.6 10*3/uL (ref 0.7–3.1)
Lymphs: 21 %
MCH: 25.1 pg — ABNORMAL LOW (ref 26.6–33.0)
MCHC: 32.5 g/dL (ref 31.5–35.7)
MCV: 77 fL — ABNORMAL LOW (ref 79–97)
Monocytes Absolute: 0.7 10*3/uL (ref 0.1–0.9)
Monocytes: 9 %
Neutrophils Absolute: 5.2 10*3/uL (ref 1.4–7.0)
Neutrophils: 68 %
Platelets: 285 10*3/uL (ref 150–450)
RBC: 3.9 x10E6/uL (ref 3.77–5.28)
RDW: 16.6 % — ABNORMAL HIGH (ref 11.7–15.4)
RPR Ser Ql: NONREACTIVE
Rh Factor: POSITIVE
Rubella Antibodies, IGG: 9.88 index (ref >0.99)
WBC: 7.7 10*3/uL (ref 3.4–10.8)

## 2021-10-14 LAB — GC/CHLAMYDIA PROBE AMP (~~LOC~~) NOT AT ARMC
Chlamydia: NEGATIVE
Comment: NEGATIVE
Comment: NORMAL
Neisseria Gonorrhea: NEGATIVE

## 2021-10-14 LAB — HCV INTERPRETATION

## 2021-10-14 LAB — HEMOGLOBIN A1C
Est. average glucose Bld gHb Est-mCnc: 123 mg/dL
Hgb A1c MFr Bld: 5.9 % — ABNORMAL HIGH (ref 4.8–5.6)

## 2021-10-15 LAB — AFP, SERUM, OPEN SPINA BIFIDA
AFP MoM: 1.83
AFP Value: 58.6 ng/mL
Gest. Age on Collection Date: 16 weeks
Maternal Age At EDD: 34.3 yr
OSBR Risk 1 IN: 2380
Test Results:: NEGATIVE
Weight: 203 [lb_av]

## 2021-10-15 MED ORDER — FERROUS SULFATE 325 (65 FE) MG PO TBEC
325.0000 mg | DELAYED_RELEASE_TABLET | ORAL | 1 refills | Status: DC
Start: 2021-10-15 — End: 2022-02-25

## 2021-10-15 NOTE — Addendum Note (Signed)
Addended by: Gavin Pound L on: 10/15/2021 09:12 PM   Modules accepted: Orders

## 2021-10-19 ENCOUNTER — Telehealth: Payer: Self-pay

## 2021-10-19 LAB — CULTURE, OB URINE

## 2021-10-19 LAB — URINE CULTURE, OB REFLEX

## 2021-10-19 NOTE — Telephone Encounter (Signed)
Left message using language line interpreter [phone] [facility] [place]. Armandina Stammer RN

## 2021-10-19 NOTE — Telephone Encounter (Signed)
-----   Message from Gerrit Heck, [place] sent at 10/15/2021  9:12 PM EST ----- Please call patient and inform of results.  I have sent a prescription for an iron supplement to the pharmacy and she needs to be scheduled for an early Glucola.  Thanks,  Shanda Bumps

## 2021-10-22 MED ORDER — CEFADROXIL 500 MG PO CAPS: 500.0000 mg | ORAL_CAPSULE | Freq: Two times a day (BID) | ORAL | 0 refills | Status: DC

## 2021-10-22 NOTE — Addendum Note (Signed)
Addended by: Gerrit Heck L on: 10/22/2021 10:39 AM   Modules accepted: Orders

## 2021-11-04 ENCOUNTER — Ambulatory Visit: Payer: Managed Care, Other (non HMO)

## 2021-11-07 DIAGNOSIS — D563 Thalassemia minor: Secondary | ICD-10-CM | POA: Insufficient documentation

## 2021-11-10 ENCOUNTER — Ambulatory Visit (INDEPENDENT_AMBULATORY_CARE_PROVIDER_SITE_OTHER): Payer: Managed Care, Other (non HMO) | Admitting: Advanced Practice Midwife

## 2021-11-10 ENCOUNTER — Encounter: Payer: Self-pay | Admitting: Advanced Practice Midwife

## 2021-11-10 ENCOUNTER — Other Ambulatory Visit: Payer: Self-pay

## 2021-11-10 VITALS — BP 125/76 | HR 105 | Wt 207.0 lb

## 2021-11-10 DIAGNOSIS — Z789 Other specified health status: Secondary | ICD-10-CM

## 2021-11-10 DIAGNOSIS — Z3480 Encounter for supervision of other normal pregnancy, unspecified trimester: Secondary | ICD-10-CM

## 2021-11-10 DIAGNOSIS — Z3A20 20 weeks gestation of pregnancy: Secondary | ICD-10-CM

## 2021-11-10 DIAGNOSIS — D563 Thalassemia minor: Secondary | ICD-10-CM

## 2021-11-10 NOTE — Progress Notes (Signed)
° °  PRENATAL VISIT NOTE  Subjective:  Karen Burke is a 36 y.o. [id number] at [redacted]w[redacted]d being seen today for ongoing prenatal care.  She is currently monitored for the following issues for this low-risk pregnancy and has Language barrier; GBS bacteriuria; Supervision of other normal pregnancy, antepartum; Obesity (BMI 30-39.9); Alpha thalassemia silent carrier; and OSA (obstructive sleep apnea) on their problem list.  Patient reports no complaints.  Contractions: Not present. Vag. Bleeding: None.  Movement: Present. Denies leaking of fluid. Husband acts as interpretor  The following portions of the patient's history were reviewed and updated as appropriate: allergies, current medications, past family history, past medical history, past social history, past surgical history and problem list.   Objective:   Vitals:   11/10/21 1013  BP: 125/76  Pulse: (!) 105  Weight: 207 lb (93.9 kg)    Fetal Status: Fetal Heart Rate (bpm): 150 Fundal Height: 20 cm Movement: Present     General:  Alert, oriented and cooperative. Patient is in no acute distress.  Skin: Skin is warm and dry. No rash noted.   Cardiovascular: Normal heart rate noted  Respiratory: Normal respiratory effort, no problems with respiration noted  Abdomen: Soft, gravid, appropriate for gestational age.  Pain/Pressure: Present     Pelvic: Cervical exam deferred        Extremities: Normal range of motion.  Edema: None  Mental Status: Normal mood and affect. Normal behavior. Normal judgment and thought content.   Assessment and Plan:  Pregnancy: [id number] at [redacted]w[redacted]d 1. [redacted] weeks gestation of pregnancy       2. Language barrier    Prefers husband to interpret  3. Supervision of other normal pregnancy, antepartum    4. Alpha thalassemia silent carrier     Discussed what this means     Recommend FOB be tested at [facility] to see his status     Referred to Genetic Counselor - AMB MFM GENETICS REFERRAL  5.   GBS urine     Treated  6.     Anemia  Preterm labor symptoms and general obstetric precautions including but not limited to vaginal bleeding, contractions, leaking of fluid and fetal movement were reviewed in detail with the patient. Please refer to After Visit Summary for other counseling recommendations.   Return in about 4 weeks (around 12/08/2021) for [facility].  Future Appointments  Date Time Provider Department Center  11/19/2021  8:30 AM [facility] NURSE [facility] None  11/26/2021  1:30 PM [facility] NURSE [facility]  11/26/2021  1:45 PM [facility] US5 [facility]  11/26/2021  2:30 PM [facility] GENETIC COUNSELING RM [facility]  12/11/2021 10:35 AM Levie Heritage, DO [facility] None  01/05/2022 10:35 AM Aviva Signs, CNM [facility] None  01/27/2022 10:35 AM Adrian Blackwater, Rhona Raider, DO C[facility] None    Wynelle Bourgeois, CNM

## 2021-11-19 ENCOUNTER — Other Ambulatory Visit: Payer: Managed Care, Other (non HMO)

## 2021-11-19 DIAGNOSIS — O99210 Obesity complicating pregnancy, unspecified trimester: Secondary | ICD-10-CM

## 2021-11-19 NOTE — Progress Notes (Signed)
Patient sent to [facility] [address].

## 2021-11-24 ENCOUNTER — Ambulatory Visit: Payer: Managed Care, Other (non HMO)

## 2021-11-26 ENCOUNTER — Ambulatory Visit: Payer: Managed Care, Other (non HMO)

## 2021-12-10 ENCOUNTER — Encounter: Payer: Managed Care, Other (non HMO) | Admitting: Family Medicine

## 2021-12-11 ENCOUNTER — Ambulatory Visit (INDEPENDENT_AMBULATORY_CARE_PROVIDER_SITE_OTHER): Payer: Managed Care, Other (non HMO) | Admitting: Family Medicine

## 2021-12-11 ENCOUNTER — Other Ambulatory Visit: Payer: Self-pay

## 2021-12-11 VITALS — BP 132/84 | HR 106 | Wt 210.0 lb

## 2021-12-11 DIAGNOSIS — Z789 Other specified health status: Secondary | ICD-10-CM

## 2021-12-11 DIAGNOSIS — Z3480 Encounter for supervision of other normal pregnancy, unspecified trimester: Secondary | ICD-10-CM

## 2021-12-11 DIAGNOSIS — E669 Obesity, unspecified: Secondary | ICD-10-CM

## 2021-12-11 DIAGNOSIS — D563 Thalassemia minor: Secondary | ICD-10-CM

## 2021-12-11 DIAGNOSIS — R8271 Bacteriuria: Secondary | ICD-10-CM

## 2021-12-11 DIAGNOSIS — Z3A24 24 weeks gestation of pregnancy: Secondary | ICD-10-CM

## 2021-12-11 MED ORDER — CLOTRIMAZOLE 1 % EX CREA: 1.0000 "application " | TOPICAL_CREAM | Freq: Two times a day (BID) | CUTANEOUS | 0 refills | Status: DC

## 2021-12-11 NOTE — Progress Notes (Signed)
? ?  PRENATAL VISIT NOTE ? ?Subjective:  ?Karen Burke is a 36 y.o. [id number] at [redacted]w[redacted]d being seen today for ongoing prenatal care.  She is currently monitored for the following issues for this high-risk pregnancy and has Language barrier; GBS bacteriuria; Supervision of other normal pregnancy, antepartum; Obesity (BMI 30-39.9); Alpha thalassemia silent carrier; and OSA (obstructive sleep apnea) on their problem list. ? ?Patient reports  itching on her nipples .   . Vag. Bleeding: None.  Movement: Present. Denies leaking of fluid.  ? ?The following portions of the patient's history were reviewed and updated as appropriate: allergies, current medications, past family history, past medical history, past social history, past surgical history and problem list.  ? ?Objective:  ? ?Vitals:  ? 12/11/21 1035  ?BP: 132/84  ?Pulse: (!) 106  ?Weight: 210 lb (95.3 kg)  ? ? ?Fetal Status: Fetal Heart Rate (bpm): 147   Movement: Present    ? ?General:  Alert, oriented and cooperative. Patient is in no acute distress.  ?Skin: Skin is warm and dry. No rash noted.   ?Cardiovascular: Normal heart rate noted  ?Respiratory: Normal respiratory effort, no problems with respiration noted  ?Abdomen: Soft, gravid, appropriate for gestational age.  Pain/Pressure: Absent     ?Pelvic: Cervical exam deferred        ?Extremities: Normal range of motion.  Edema: None  ?Mental Status: Normal mood and affect. Normal behavior. Normal judgment and thought content.  ? ?Assessment and Plan:  ?Pregnancy: [id number] at [redacted]w[redacted]d ?1. [redacted] weeks gestation of pregnancy ? ?2. Supervision of other normal pregnancy, antepartum ?FHT normal ? ?3. Alpha thalassemia silent carrier ?Referral to genetics previously placed ? ?4. GBS bacteriuria ?Intrapartum PPX ? ?5. Language barrier ?Interpreter used ? ?6. Obesity (BMI 30-39.9) ? ? ?Preterm labor symptoms and general obstetric precautions including but not limited to vaginal bleeding, contractions, leaking of fluid and fetal  movement were reviewed in detail with the patient. ?Please refer to After Visit Summary for other counseling recommendations.  ? ?No follow-ups on file. ? ?Future Appointments  ?Date Time Provider Department Center  ?12/16/2021  9:30 AM [facility] NURSE [facility]  ?12/16/2021  9:45 AM [facility] US6 [facility]  ?12/16/2021 10:30 AM WMC-MFC GENETIC COUNSELING RM W[facility]-MFC [facility]  ?01/05/2022 10:35 AM Aviva Signs, CNM [facility] None  ?01/27/2022 10:35 AM Adrian Blackwater Rhona Raider, DO [facility] None  ? ? ?Levie Heritage, DO ? ?

## 2021-12-11 NOTE — Progress Notes (Signed)
[employer] interpreter Hilda Lias [phone]. ?

## 2021-12-16 ENCOUNTER — Other Ambulatory Visit: Payer: Self-pay

## 2021-12-16 ENCOUNTER — Other Ambulatory Visit: Payer: Self-pay | Admitting: *Deleted

## 2021-12-16 ENCOUNTER — Ambulatory Visit: Payer: Self-pay | Admitting: Genetics

## 2021-12-16 ENCOUNTER — Ambulatory Visit (HOSPITAL_BASED_OUTPATIENT_CLINIC_OR_DEPARTMENT_OTHER): Payer: Managed Care, Other (non HMO)

## 2021-12-16 ENCOUNTER — Ambulatory Visit: Payer: Managed Care, Other (non HMO) | Admitting: *Deleted

## 2021-12-16 ENCOUNTER — Encounter: Payer: Self-pay | Admitting: *Deleted

## 2021-12-16 VITALS — BP 125/77 | HR 100

## 2021-12-16 DIAGNOSIS — O99212 Obesity complicating pregnancy, second trimester: Secondary | ICD-10-CM | POA: Insufficient documentation

## 2021-12-16 DIAGNOSIS — D563 Thalassemia minor: Secondary | ICD-10-CM | POA: Insufficient documentation

## 2021-12-16 DIAGNOSIS — Z148 Genetic carrier of other disease: Secondary | ICD-10-CM | POA: Insufficient documentation

## 2021-12-16 DIAGNOSIS — Z363 Encounter for antenatal screening for malformations: Secondary | ICD-10-CM | POA: Insufficient documentation

## 2021-12-16 DIAGNOSIS — Z3480 Encounter for supervision of other normal pregnancy, unspecified trimester: Secondary | ICD-10-CM | POA: Diagnosis not present

## 2021-12-16 DIAGNOSIS — Z362 Encounter for other antenatal screening follow-up: Secondary | ICD-10-CM

## 2021-12-16 DIAGNOSIS — Z3A25 25 weeks gestation of pregnancy: Secondary | ICD-10-CM | POA: Insufficient documentation

## 2021-12-16 DIAGNOSIS — O99210 Obesity complicating pregnancy, unspecified trimester: Secondary | ICD-10-CM | POA: Diagnosis not present

## 2021-12-16 DIAGNOSIS — Z3A16 16 weeks gestation of pregnancy: Secondary | ICD-10-CM | POA: Diagnosis not present

## 2021-12-16 DIAGNOSIS — Z3689 Encounter for other specified antenatal screening: Secondary | ICD-10-CM

## 2021-12-16 NOTE — Progress Notes (Signed)
?Name: Melodie Bouillon Indication: Silent Carrier for Alpha Thalassemia (aa/a-)  ?DOB: [birth year] Age: 36 y.o.   ?EDC: 03/30/2022 LMP: 06/23/2021 Referring Provider:  ?Aviva Signs, CNM  ?EGA: [redacted]w[redacted]d Genetic Counselor: ?Teena Dunk, MS, CGC  ?OB Hx: [id number] Date of Appointment: 12/16/2021  ?Accompanied by: Her reproductive partner Face to Face Time: 30 Minutes  ? ?Previous Testing Completed:  ?[place] previously completed Non-Invasive Prenatal Screening (NIPS) in this pregnancy. The result is low risk. This screening significantly reduces the risk that the current pregnancy has Down syndrome, Trisomy 49, Trisomy 36, and common sex chromosome conditions, however, the risk is not zero given the limitations of NIPS. Additionally, there are many genetic conditions that cannot be detected by NIPS.  ?[place] previously completed carrier screening. She screened to be a silent carrier for alpha thalassemia. She screened to not be a carrier for Cystic Fibrosis (CF), Spinal Muscular Atrophy (SMA), and beta hemoglobinopathies. A negative result on carrier screening reduces the likelihood of being a carrier, however, does not entirely rule out the possibility. ?[place] previously completed a maternal serum AFP screen in this pregnancy. The result is screen negative. A negative result reduces the risk that the current pregnancy has an open neural tube defect. Closed neural tube defects and some open defects may not be detected by this screen.   ? ?Medical History:  ?This is Zaidee's 6th pregnancy. She has 2 living children. She reports 3 early losses. ?Reports she takes Iron, Aspirin, and Prenatal Vitamins. ?Denies personal history of diabetes, high blood pressure, thyroid conditions, and seizures. ?Denies bleeding, infections, and fevers in this pregnancy. ?Denies using tobacco, alcohol, or street drugs in this pregnancy.  ? ?Family History: A pedigree was created and scanned into Epic under the Media tab. ?Maternal  ethnicity reported as [place] and paternal ethnicity reported as [place]. ?Denies Ashkenazi Jewish ancestry. ?Family history not remarkable for consanguinity, individuals with birth defects, intellectual disability, autism spectrum disorder, still births, or unexplained neonatal death.  ?   ?Genetic Counseling:  ? ?Silent Carrier for Alpha Thalassemia. Damika is a silent carrier for alpha thalassemia (??/?-). Positive for the pathogenic alpha 3.7 deletion of the HBA2 gene. 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 Each of Kristalynn's children will either inherit two functional copies or one functional copy with one deletion from her. Jaton is not at an increased risk to have a baby with fetal hydrops due to Hemoglobin Barts disease (--/--) regardless of her reproductive partner's carrier status. We discussed there would be a 25% risk for the current pregnancy to be affected with Hemoglobin H disease (--/?-) if her reproductive partner is found to be an alpha thalassemia carrier in the cis configuration (??/--). Clinical features of Hemoglobin H disease are highly variable and generally develop in the first years of life. The primary symptoms include moderate anemia with marked microcytosis, jaundice, and hepatosplenomegaly. Some affected individuals do not require blood transfusions while others may require occasional blood transfusions throughout their lifetime. Because Brittini is a silent carrier for alpha thalassemia (??/?-), carrier screening for her reproductive partner is recommended to determine risk for the current pregnancy. If her reproductive partner is a non-carrier (??/??), a silent carrier (??/?-), or a carrier in the trans configuration (?-/?-) there would not be an increased risk for the pregnancy to have Hemoglobin H disease. ? ?Birth Defects. All babies have approximately a 3-5%  risk for a  birth defect and a majority of these defects cannot be detected through the screening or diagnostic testing listed below. Ultrasound may detect some birth defects, but it may not detect all birth defects. About half of pregnancies with Down syndrome do not show any soft markers on ultrasound. A normal ultrasound does not guarantee a healthy pregnancy. ?  ? ?Testing/Screening Options:  ? ?Carrier Screening. Per the ACOG Committee Opinion 691, if an individual is found to be a carrier for a specific condition, the individual's reproductive partner should be offered testing in order to receive informed genetic counseling about potential reproductive outcomes. Genetic counseling offered Natalee's reproductive partner carrier screening for alpha thalassemia given Milayah's carrier status. ?  ?  ?Patient Plan: ? ?Proceed with: Routine prenatal care ?Informed consent was obtained. All questions were answered. ? ?Declined: Carrier Screening for Hanadi's reproductive partner for alpha thalassemia  ? ?Thank you for sharing in the care of Delayna with [place].  ?Please do not hesitate to contact us if you have any questions. ? ?Teena Dunk, MS, CGC ?Certified Genetic Counselor ?

## 2022-01-05 ENCOUNTER — Ambulatory Visit (INDEPENDENT_AMBULATORY_CARE_PROVIDER_SITE_OTHER): Payer: Managed Care, Other (non HMO) | Admitting: Advanced Practice Midwife

## 2022-01-05 VITALS — BP 121/79 | HR 98 | Wt 209.0 lb

## 2022-01-05 DIAGNOSIS — Z3A28 28 weeks gestation of pregnancy: Secondary | ICD-10-CM | POA: Diagnosis not present

## 2022-01-05 DIAGNOSIS — Z3480 Encounter for supervision of other normal pregnancy, unspecified trimester: Secondary | ICD-10-CM

## 2022-01-05 DIAGNOSIS — Z23 Encounter for immunization: Secondary | ICD-10-CM

## 2022-01-05 DIAGNOSIS — D563 Thalassemia minor: Secondary | ICD-10-CM

## 2022-01-05 NOTE — Progress Notes (Signed)
[employer] language services interpreter Deneise Lever [phone]. ?

## 2022-01-05 NOTE — Progress Notes (Signed)
Subjective:  ? ?Karen Burke is a 36 y.o. [id number] [redacted]w[redacted]d being seen today for her obstetrical visit.  Patient reports no complaints.  Denies contractions, vaginal bleeding or leaking of fluid.  Reports good fetal movement. ? ?The following portions of the patient's history were reviewed and updated as appropriate: allergies, current medications, past family history, past medical history, past social history, past surgical history and problem list.  ? ?Objective:  ?BP 121/79   Pulse 98   Wt 209 lb (94.8 kg)   LMP 06/23/2021 (Approximate)   BMI 40.82 kg/m?  ? ?FHT: Fetal Heart Rate (bpm): 132  ?Uterine Size: Fundal Height: 29 cm  ?Fetal Movement: Movement: Present  ?Presentation:    ? ? ?Abdomen:  soft, gravid, appropriate for gestational age,non-tender  ?   ?   ? ? ? ?Assessment and Plan:  ? ?Pregnancy:  [id number] at [redacted]w[redacted]d ? ?1. [redacted] weeks gestation of pregnancy ?     One hour glucola today since pt is here late ?- Tdap vaccine greater than or equal to 7yo IM ?- Glucose tolerance, 1 hour ?- CBC ?- HIV Antibody (routine testing w rflx) ?- RPR ? ?2. Supervision of other normal pregnancy, antepartum ? ?- Glucose tolerance, 1 hour ?- CBC ?- HIV Antibody (routine testing w rflx) ?- RPR ? ?3. Alpha thalassemia silent carrier ?    Saw Genetic Counselor on 12/16/21 ?    Declined partner screening ? ? ?Preterm labor symptoms: vaginal bleeding, contractions, and leaking of fluid reviewed in detail.  Fetal movement precautions reviewed. ? ?Follow up in 2 weeks. ? ?Aviva Signs, CNM ? ?

## 2022-01-06 LAB — CBC
Hematocrit: 29.7 % — ABNORMAL LOW (ref 34.0–46.6)
Hemoglobin: 9.5 g/dL — ABNORMAL LOW (ref 11.1–15.9)
MCH: 25.5 pg — ABNORMAL LOW (ref 26.6–33.0)
MCHC: 32 g/dL (ref 31.5–35.7)
MCV: 80 fL (ref 79–97)
Platelets: 248 10*3/uL (ref 150–450)
RBC: 3.72 x10E6/uL — ABNORMAL LOW (ref 3.77–5.28)
RDW: 16.2 % — ABNORMAL HIGH (ref 11.7–15.4)
WBC: 6.7 10*3/uL (ref 3.4–10.8)

## 2022-01-06 LAB — GLUCOSE TOLERANCE, 1 HOUR: Glucose, 1Hr PP: 88 mg/dL (ref 70–199)

## 2022-01-06 LAB — HIV ANTIBODY (ROUTINE TESTING W REFLEX): HIV Screen 4th Generation wRfx: NONREACTIVE

## 2022-01-06 LAB — RPR: RPR Ser Ql: NONREACTIVE

## 2022-01-27 ENCOUNTER — Ambulatory Visit (INDEPENDENT_AMBULATORY_CARE_PROVIDER_SITE_OTHER): Payer: Managed Care, Other (non HMO) | Admitting: Family Medicine

## 2022-01-27 VITALS — BP 114/74 | HR 108 | Wt 214.0 lb

## 2022-01-27 DIAGNOSIS — Z3480 Encounter for supervision of other normal pregnancy, unspecified trimester: Secondary | ICD-10-CM

## 2022-01-27 DIAGNOSIS — E669 Obesity, unspecified: Secondary | ICD-10-CM

## 2022-01-27 DIAGNOSIS — R8271 Bacteriuria: Secondary | ICD-10-CM
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
## 2022-01-28 ENCOUNTER — Encounter: Payer: Self-pay | Admitting: *Deleted

## 2022-01-28 ENCOUNTER — Ambulatory Visit: Payer: Managed Care, Other (non HMO) | Attending: Obstetrics and Gynecology

## 2022-01-28 ENCOUNTER — Ambulatory Visit: Payer: Managed Care, Other (non HMO) | Admitting: *Deleted

## 2022-01-28 ENCOUNTER — Other Ambulatory Visit: Payer: Self-pay | Admitting: *Deleted

## 2022-01-28 VITALS — BP 115/67 | HR 94

## 2022-01-28 DIAGNOSIS — O285 Abnormal chromosomal and genetic finding on antenatal screening of mother: Secondary | ICD-10-CM

## 2022-01-28 DIAGNOSIS — O99212 Obesity complicating pregnancy, second trimester: Secondary | ICD-10-CM | POA: Insufficient documentation

## 2022-01-28 DIAGNOSIS — O99213 Obesity complicating pregnancy, third trimester: Secondary | ICD-10-CM

## 2022-01-28 DIAGNOSIS — Z362 Encounter for other antenatal screening follow-up: Secondary | ICD-10-CM

## 2022-01-28 DIAGNOSIS — Z3A31 31 weeks gestation of pregnancy: Secondary | ICD-10-CM

## 2022-01-28 DIAGNOSIS — D563 Thalassemia minor: Secondary | ICD-10-CM | POA: Diagnosis not present

## 2022-02-10 ENCOUNTER — Ambulatory Visit (INDEPENDENT_AMBULATORY_CARE_PROVIDER_SITE_OTHER): Payer: Managed Care, Other (non HMO) | Admitting: Family Medicine

## 2022-02-10 VITALS — BP 124/76 | HR 101 | Wt 219.0 lb

## 2022-02-10 DIAGNOSIS — Z789 Other specified health status: Secondary | ICD-10-CM

## 2022-02-10 DIAGNOSIS — E669 Obesity, unspecified: Secondary | ICD-10-CM

## 2022-02-10 DIAGNOSIS — Z758 Other problems related to medical facilities and other health care: Secondary | ICD-10-CM

## 2022-02-10 DIAGNOSIS — R8271 Bacteriuria: Secondary | ICD-10-CM

## 2022-02-10 DIAGNOSIS — Z3480 Encounter for supervision of other normal pregnancy, unspecified trimester: Secondary | ICD-10-CM

## 2022-02-10 NOTE — Progress Notes (Addendum)
? ?  PRENATAL VISIT NOTE ? ?Subjective:  ?Karen Burke is a 36 y.o. [phone] at [redacted]w[redacted]d being seen today for ongoing prenatal care.  She is currently monitored for the following issues for this low-risk pregnancy and has Language barrier; GBS bacteriuria; Supervision of other normal pregnancy, antepartum; Obesity (BMI 30-39.9); Alpha thalassemia silent carrier; and OSA (obstructive sleep apnea) on their problem list. ? ?Patient reports no complaints.  Contractions: Not present. Vag. Bleeding: None.  Movement: Present. Denies leaking of fluid.  ? ?The following portions of the patient's history were reviewed and updated as appropriate: allergies, current medications, past family history, past medical history, past social history, past surgical history and problem list.  ? ?Objective:  ? ?Vitals:  ? 02/10/22 1108 02/10/22 1113  ?BP: (!) 125/93 124/76  ?Pulse: (!) 110 (!) 101  ?Weight: 219 lb (99.3 kg)   ? ? ?Fetal Status: Fetal Heart Rate (bpm): 142   Movement: Present    ? ?General:  Alert, oriented and cooperative. Patient is in no acute distress.  ?Skin: Skin is warm and dry. No rash noted.   ?Cardiovascular: Normal heart rate noted  ?Respiratory: Normal respiratory effort, no problems with respiration noted  ?Abdomen: Soft, gravid, appropriate for gestational age.  Pain/Pressure: Present     ?Pelvic: Cervical exam deferred        ?Extremities: Normal range of motion.     ?Mental Status: Normal mood and affect. Normal behavior. Normal judgment and thought content.  ? ?Assessment and Plan:  ?Pregnancy: [id number] at [redacted]w[redacted]d ?1. Supervision of other normal pregnancy, antepartum ?FHT and FH normal ? ?2. GBS bacteriuria ?Intrapartum PPx. ? ?3. Obesity (BMI 30-39.9) ?EFW 32% ? ?4. Language barrier ?Husband acts as interpreter ? ?Preterm labor symptoms and general obstetric precautions including but not limited to vaginal bleeding, contractions, leaking of fluid and fetal movement were reviewed in detail with the  patient. ?Please refer to After Visit Summary for other counseling recommendations.  ? ?No follow-ups on file. ? ?Future Appointments  ?Date Time Provider [place]  ?02/19/2022  2:15 PM [facility] NURSE [facility]  ?02/19/2022  2:30 PM [facility]FC US2 [facility]-M[facility]US [place]  ?02/24/2022 10:15 AM Truett Mainland, DO [facility] None  ?02/25/2022  2:30 PM [facility] NURSE [facility]  ?02/25/2022  2:45 PM [facility] US5 [facility]  ?03/05/2022  2:30 PM [facility] NURSE [facility]  ?03/05/2022  2:45 PM W[facility]FC US5 WMC-MFCUS [place]  ?03/10/2022 10:35 AM Nehemiah Settle, Tanna Savoy, DO [facility] None  ?03/11/2022  2:30 PM [facility] NURSE [facility]  ?03/17/2022 10:35 AM Nehemiah Settle Tanna Savoy, DO [facility] None  ?03/24/2022 10:15 AM Truett Mainland, DO [facility] None  ?03/31/2022 10:35 AM Nehemiah Settle Tanna Savoy, DO [facility] None  ? ? ?Truett Mainland, DO ?

## 2022-02-19 ENCOUNTER — Ambulatory Visit: Payer: Managed Care, Other (non HMO) | Attending: Obstetrics

## 2022-02-19 ENCOUNTER — Ambulatory Visit: Payer: Managed Care, Other (non HMO) | Admitting: *Deleted

## 2022-02-19 VITALS — BP 147/87 | HR 115

## 2022-02-19 DIAGNOSIS — Z148 Genetic carrier of other disease: Secondary | ICD-10-CM | POA: Insufficient documentation

## 2022-02-19 DIAGNOSIS — Z362 Encounter for other antenatal screening follow-up: Secondary | ICD-10-CM | POA: Diagnosis not present

## 2022-02-19 DIAGNOSIS — E669 Obesity, unspecified: Secondary | ICD-10-CM | POA: Diagnosis not present

## 2022-02-19 DIAGNOSIS — Z3A34 34 weeks gestation of pregnancy: Secondary | ICD-10-CM | POA: Insufficient documentation

## 2022-02-19 DIAGNOSIS — O99213 Obesity complicating pregnancy, third trimester: Secondary | ICD-10-CM | POA: Insufficient documentation

## 2022-02-19 DIAGNOSIS — R8271 Bacteriuria: Secondary | ICD-10-CM

## 2022-02-19 DIAGNOSIS — Z3480 Encounter for supervision of other normal pregnancy, unspecified trimester: Secondary | ICD-10-CM

## 2022-02-24 ENCOUNTER — Ambulatory Visit (INDEPENDENT_AMBULATORY_CARE_PROVIDER_SITE_OTHER): Payer: Managed Care, Other (non HMO) | Admitting: Family Medicine

## 2022-02-24 ENCOUNTER — Other Ambulatory Visit (HOSPITAL_COMMUNITY)
Admission: RE | Admit: 2022-02-24 | Discharge: 2022-02-24 | Disposition: A | Discharge status: Home / Self Care | Payer: Managed Care, Other (non HMO) | Source: Ambulatory Visit | Attending: Family Medicine | Admitting: Family Medicine

## 2022-02-24 VITALS — BP 151/96 | HR 96 | Wt 219.0 lb

## 2022-02-24 DIAGNOSIS — R8271 Bacteriuria: Secondary | ICD-10-CM

## 2022-02-24 DIAGNOSIS — Z3480 Encounter for supervision of other normal pregnancy, unspecified trimester: Secondary | ICD-10-CM | POA: Insufficient documentation

## 2022-02-24 DIAGNOSIS — Z789 Other specified health status: Secondary | ICD-10-CM

## 2022-02-24 DIAGNOSIS — E669 Obesity, unspecified: Secondary | ICD-10-CM

## 2022-02-24 DIAGNOSIS — R030 Elevated blood-pressure reading, without diagnosis of hypertension: Secondary | ICD-10-CM

## 2022-02-24 DIAGNOSIS — Z3A35 35 weeks gestation of pregnancy: Secondary | ICD-10-CM

## 2022-02-24 MED ORDER — TERCONAZOLE 0.4 % VA CREA: 1.0000 | TOPICAL_CREAM | Freq: Every day | VAGINAL | 0 refills | Status: DC

## 2022-02-24 NOTE — Progress Notes (Signed)
   PRENATAL VISIT NOTE  Subjective:  Karen Burke is a 36 y.o. [id number] at 24w1dbeing seen today for ongoing prenatal care.  She is currently monitored for the following issues for this high-risk pregnancy and has Language barrier; GBS bacteriuria; Supervision of other normal pregnancy, antepartum; Obesity (BMI 30-39.9); Alpha thalassemia silent carrier; and OSA (obstructive sleep apnea) on their problem list.  Patient reports  vaginal itching .  Contractions: Not present. Vag. Bleeding: None.  Movement: Present. Denies leaking of fluid.   The following portions of the patient's history were reviewed and updated as appropriate: allergies, current medications, past family history, past medical history, past social history, past surgical history and problem list.   Objective:   Vitals:   02/24/22 1020 02/24/22 1025  BP: (!) 149/85 (!) 151/96  Pulse: 94 96  Weight: 219 lb (99.3 kg)     Fetal Status: Fetal Heart Rate (bpm): 140 Fundal Height: 40 cm Movement: Present  Presentation: Vertex  General:  Alert, oriented and cooperative. Patient is in no acute distress.  Skin: Skin is warm and dry. No rash noted.   Cardiovascular: Normal heart rate noted  Respiratory: Normal respiratory effort, no problems with respiration noted  Abdomen: Soft, gravid, appropriate for gestational age.  Pain/Pressure: Absent     Pelvic: Cervical exam deferred        Extremities: Normal range of motion.  Edema: None  Mental Status: Normal mood and affect. Normal behavior. Normal judgment and thought content.   Assessment and Plan:  Pregnancy: [id number] 331w1d. [redacted] weeks gestation of pregnancy - Protein / creatinine ratio, urine  2. Supervision of other normal pregnancy, antepartum FH and FHT normal - Cervicovaginal ancillary only( [place]) - Comp Met (CMET) - CBC  3. GBS bacteriuria Intrapartum PPx  4. Obesity (BMI 30-39.9) - Protein / creatinine ratio, urine  5. Language barrier - Protein /  creatinine ratio, urine  6. Elevated BP without diagnosis of hypertension Check labs.  May need to induce at 37 weeks. Severe symptoms reviewed. - Comp Met (CMET) - CBC - Protein / creatinine ratio, urine  Preterm labor symptoms and general obstetric precautions including but not limited to vaginal bleeding, contractions, leaking of fluid and fetal movement were reviewed in detail with the patient. Please refer to After Visit Summary for other counseling recommendations.   No follow-ups on file.  Future Appointments  Date Time Provider [place]5/25/2023  2:[facility]5/25/2023  2:45 PM [facility]/11[facility]2021  2:30 PM [facility]URSE [facility]21  2:45 PM [facility] US5 [facility]6/04/2022 10:35 AM StTruett MainlandDO CWH-WMHP None  03/11/2022  2:30 PM [facility] NURSE [facility]6/14/2023 10:35 AM StTruett MainlandDO C[facility] None  03/24/2022 10:15 AM StTruett MainlandDO CWH-WMHP None  03/31/2022 10:35 AM StNehemiah SettleaTanna SavoyDO CWH-WMHP None    JaTruett MainlandDO

## 2022-02-25 ENCOUNTER — Other Ambulatory Visit: Payer: Self-pay | Admitting: Obstetrics

## 2022-02-25 ENCOUNTER — Ambulatory Visit: Payer: Managed Care, Other (non HMO) | Admitting: *Deleted

## 2022-02-25 ENCOUNTER — Ambulatory Visit: Payer: Managed Care, Other (non HMO) | Attending: Obstetrics

## 2022-02-25 VITALS — BP 138/85 | HR 105

## 2022-02-25 DIAGNOSIS — Z3480 Encounter for supervision of other normal pregnancy, unspecified trimester: Secondary | ICD-10-CM | POA: Insufficient documentation

## 2022-02-25 DIAGNOSIS — D563 Thalassemia minor: Secondary | ICD-10-CM | POA: Diagnosis not present

## 2022-02-25 DIAGNOSIS — O285 Abnormal chromosomal and genetic finding on antenatal screening of mother: Secondary | ICD-10-CM

## 2022-02-25 DIAGNOSIS — O283 Abnormal ultrasonic finding on antenatal screening of mother: Secondary | ICD-10-CM | POA: Insufficient documentation

## 2022-02-25 DIAGNOSIS — R8271 Bacteriuria: Secondary | ICD-10-CM

## 2022-02-25 DIAGNOSIS — E669 Obesity, unspecified: Secondary | ICD-10-CM | POA: Diagnosis not present

## 2022-02-25 DIAGNOSIS — Z3A35 35 weeks gestation of pregnancy: Secondary | ICD-10-CM | POA: Insufficient documentation

## 2022-02-25 DIAGNOSIS — Z148 Genetic carrier of other disease: Secondary | ICD-10-CM | POA: Insufficient documentation

## 2022-02-25 DIAGNOSIS — O99213 Obesity complicating pregnancy, third trimester: Secondary | ICD-10-CM

## 2022-02-25 LAB — CERVICOVAGINAL ANCILLARY ONLY
Bacterial Vaginitis (gardnerella): NEGATIVE
Candida Glabrata: NEGATIVE
Candida Vaginitis: POSITIVE — AB
Chlamydia: NEGATIVE
Comment: NEGATIVE
Comment: NEGATIVE
Comment: NEGATIVE
Comment: NEGATIVE
Comment: NORMAL
Neisseria Gonorrhea: NEGATIVE

## 2022-02-25 LAB — COMPREHENSIVE METABOLIC PANEL
ALT: 9 IU/L (ref 0–32)
AST: 10 IU/L (ref 0–40)
Albumin/Globulin Ratio: 1 — ABNORMAL LOW (ref 1.2–2.2)
Albumin: 3.3 g/dL — ABNORMAL LOW (ref 3.8–4.8)
Alkaline Phosphatase: 166 IU/L — ABNORMAL HIGH (ref 44–121)
BUN/Creatinine Ratio: 6 — ABNORMAL LOW (ref 9–23)
BUN: 4 mg/dL — ABNORMAL LOW (ref 6–20)
Bilirubin Total: <0.2 mg/dL (ref 0.0–1.2)
CO2: 23 mmol/L (ref 20–29)
Calcium: 9.2 mg/dL (ref 8.7–10.2)
Chloride: 102 mmol/L (ref 96–106)
Creatinine, Ser: 0.71 mg/dL (ref 0.57–1.00)
Globulin, Total: 3.2 g/dL (ref 1.5–4.5)
Glucose: 83 mg/dL (ref 70–99)
Potassium: 4.6 mmol/L (ref 3.5–5.2)
Sodium: 136 mmol/L (ref 134–144)
Total Protein: 6.5 g/dL (ref 6.0–8.5)
eGFR: 114 mL/min/{1.73_m2} (ref >59)

## 2022-02-25 LAB — CBC
Hematocrit: 31.8 % — ABNORMAL LOW (ref 34.0–46.6)
Hemoglobin: 9.9 g/dL — ABNORMAL LOW (ref 11.1–15.9)
MCH: 24.9 pg — ABNORMAL LOW (ref 26.6–33.0)
MCHC: 31.1 g/dL — ABNORMAL LOW (ref 31.5–35.7)
MCV: 80 fL (ref 79–97)
Platelets: 228 10*3/uL (ref 150–450)
RBC: 3.98 x10E6/uL (ref 3.77–5.28)
RDW: 17.3 % — ABNORMAL HIGH (ref 11.7–15.4)
WBC: 6.9 10*3/uL (ref 3.4–10.8)

## 2022-02-25 LAB — PROTEIN / CREATININE RATIO, URINE
Creatinine, Urine: 89.5 mg/dL
Protein, Ur: 28 mg/dL
Protein/Creat Ratio: 313 mg/g creat — ABNORMAL HIGH (ref 0–200)

## 2022-02-25 NOTE — Procedures (Signed)
Karen Burke 1986-07-14 [redacted]w[redacted]d  Fetus A Non-Stress Test Interpretation for 02/25/22  Indication: Unsatisfactory BPP  Fetal Heart Rate A Mode: External Baseline Rate (A): 135 bpm Variability: Moderate Accelerations: 15 x 15 Decelerations: None Multiple birth?: No  Uterine Activity Mode: Palpation, Toco Contraction Frequency (min): 1 uc with ui Contraction Duration (sec): 60 Contraction Quality: Mild Resting Tone Palpated: Relaxed Resting Time: Adequate  Interpretation (Fetal Testing) Nonstress Test Interpretation: Reactive Overall Impression: Reassuring for gestational age Comments: Dr. Parke Poisson reviewed tracing

## 2022-03-05 ENCOUNTER — Ambulatory Visit: Payer: Managed Care, Other (non HMO) | Attending: Maternal & Fetal Medicine | Admitting: Maternal & Fetal Medicine

## 2022-03-05 ENCOUNTER — Ambulatory Visit: Payer: Managed Care, Other (non HMO) | Admitting: *Deleted

## 2022-03-05 ENCOUNTER — Ambulatory Visit (HOSPITAL_BASED_OUTPATIENT_CLINIC_OR_DEPARTMENT_OTHER): Payer: Managed Care, Other (non HMO)

## 2022-03-05 VITALS — BP 153/95 | HR 93

## 2022-03-05 DIAGNOSIS — Z3A36 36 weeks gestation of pregnancy: Secondary | ICD-10-CM | POA: Insufficient documentation

## 2022-03-05 DIAGNOSIS — E669 Obesity, unspecified: Secondary | ICD-10-CM | POA: Diagnosis not present

## 2022-03-05 DIAGNOSIS — O285 Abnormal chromosomal and genetic finding on antenatal screening of mother: Secondary | ICD-10-CM

## 2022-03-05 DIAGNOSIS — R8271 Bacteriuria: Secondary | ICD-10-CM

## 2022-03-05 DIAGNOSIS — O99213 Obesity complicating pregnancy, third trimester: Secondary | ICD-10-CM

## 2022-03-05 DIAGNOSIS — O1413 Severe pre-eclampsia, third trimester: Secondary | ICD-10-CM | POA: Diagnosis not present

## 2022-03-05 DIAGNOSIS — O133 Gestational [pregnancy-induced] hypertension without significant proteinuria, third trimester: Secondary | ICD-10-CM

## 2022-03-05 DIAGNOSIS — Z148 Genetic carrier of other disease: Secondary | ICD-10-CM | POA: Insufficient documentation

## 2022-03-05 DIAGNOSIS — Z3480 Encounter for supervision of other normal pregnancy, unspecified trimester: Secondary | ICD-10-CM

## 2022-03-05 DIAGNOSIS — O1414 Severe pre-eclampsia complicating childbirth: Secondary | ICD-10-CM | POA: Diagnosis not present

## 2022-03-05 DIAGNOSIS — D563 Thalassemia minor: Secondary | ICD-10-CM | POA: Insufficient documentation

## 2022-03-05 NOTE — Progress Notes (Signed)
MFM Brief Note  Karen Burke is a 36 yo G6P2 who is heere at 70 w 3d for antenatal testing at the request of Dr. Adrian Blackwater due to [facility].  She is overall doing well. She denies headache vision changes but reports new onset leg edema.  Antenatal testing performed given maternal GHTN The biophysical profile was 8/8 with good fetal movement and amniotic fluid volume.  Today we observed blood pressure 163/91 with repeat 153/95 mmHg.   In review of her labs she had a normal CMP and CBC  on 5/24 but an elevated UPC of 313. Thus she may have preeclampsia without severe features.  I recommended she go to the MAU for preeclampsia evaluation, including repeat labs, if her labs are abnormal or her blood pressure suggest severe features consider delivery otherwise consider delivery at 37 weeks as previously planned.  I spent 30 minutes with > 50% in face to face consultation.   I discussed this plan of care with Edd Arbour, CNM  Jacqualine Code, MD

## 2022-03-06 ENCOUNTER — Inpatient Hospital Stay (EMERGENCY_DEPARTMENT_HOSPITAL)
Admission: AD | Admit: 2022-03-06 | Discharge: 2022-03-07 | Discharge status: Against Medical Advice | Payer: Managed Care, Other (non HMO) | Source: Home / Self Care | Attending: Obstetrics and Gynecology | Admitting: Obstetrics and Gynecology

## 2022-03-06 ENCOUNTER — Encounter (HOSPITAL_COMMUNITY): Payer: Self-pay | Admitting: Obstetrics and Gynecology

## 2022-03-06 ENCOUNTER — Other Ambulatory Visit: Payer: Self-pay

## 2022-03-06 DIAGNOSIS — O1410 Severe pre-eclampsia, unspecified trimester: Secondary | ICD-10-CM | POA: Insufficient documentation

## 2022-03-06 DIAGNOSIS — O09523 Supervision of elderly multigravida, third trimester: Secondary | ICD-10-CM | POA: Insufficient documentation

## 2022-03-06 DIAGNOSIS — O1413 Severe pre-eclampsia, third trimester: Secondary | ICD-10-CM

## 2022-03-06 DIAGNOSIS — Z3A36 36 weeks gestation of pregnancy: Secondary | ICD-10-CM | POA: Insufficient documentation

## 2022-03-06 DIAGNOSIS — O0943 Supervision of pregnancy with grand multiparity, third trimester: Secondary | ICD-10-CM | POA: Insufficient documentation

## 2022-03-06 DIAGNOSIS — R8271 Bacteriuria: Secondary | ICD-10-CM

## 2022-03-06 DIAGNOSIS — Z3689 Encounter for other specified antenatal screening: Secondary | ICD-10-CM | POA: Insufficient documentation

## 2022-03-06 DIAGNOSIS — Z3480 Encounter for supervision of other normal pregnancy, unspecified trimester: Secondary | ICD-10-CM

## 2022-03-06 LAB — CBC
HCT: 30.8 % — ABNORMAL LOW (ref 36.0–46.0)
Hemoglobin: 9.8 g/dL — ABNORMAL LOW (ref 12.0–15.0)
MCH: 25.7 pg — ABNORMAL LOW (ref 26.0–34.0)
MCHC: 31.8 g/dL (ref 30.0–36.0)
MCV: 80.8 fL (ref 80.0–100.0)
Platelets: 220 K/uL (ref 150–400)
RBC: 3.81 MIL/uL — ABNORMAL LOW (ref 3.87–5.11)
RDW: 18.4 % — ABNORMAL HIGH (ref 11.5–15.5)
WBC: 6.9 K/uL (ref 4.0–10.5)
nRBC: 0 % (ref 0.0–0.2)

## 2022-03-06 LAB — URINALYSIS, ROUTINE W REFLEX MICROSCOPIC
Bacteria, UA: NONE SEEN
Bilirubin Urine: NEGATIVE
Glucose, UA: NEGATIVE mg/dL
Hgb urine dipstick: NEGATIVE
Ketones, ur: NEGATIVE mg/dL
Leukocytes,Ua: NEGATIVE
Nitrite: NEGATIVE
Protein, ur: 30 mg/dL — AB
Specific Gravity, Urine: 1.012 (ref 1.005–1.030)
pH: 7 (ref 5.0–8.0)

## 2022-03-06 LAB — TYPE AND SCREEN
ABO/RH(D): O POS
Antibody Screen: NEGATIVE

## 2022-03-06 LAB — COMPREHENSIVE METABOLIC PANEL
ALT: 12 U/L (ref 0–44)
AST: 17 U/L (ref 15–41)
Albumin: 2.4 g/dL — ABNORMAL LOW (ref 3.5–5.0)
Alkaline Phosphatase: 157 U/L — ABNORMAL HIGH (ref 38–126)
Anion gap: 8 (ref 5–15)
BUN: 5 mg/dL — ABNORMAL LOW (ref 6–20)
CO2: 24 mmol/L (ref 22–32)
Calcium: 9.2 mg/dL (ref 8.9–10.3)
Chloride: 103 mmol/L (ref 98–111)
Creatinine, Ser: 0.77 mg/dL (ref 0.44–1.00)
GFR, Estimated: >60 mL/min (ref >60)
Glucose, Bld: 97 mg/dL (ref 70–99)
Potassium: 3.9 mmol/L (ref 3.5–5.1)
Sodium: 135 mmol/L (ref 135–145)
Total Bilirubin: 0.3 mg/dL (ref 0.3–1.2)
Total Protein: 6.5 g/dL (ref 6.5–8.1)

## 2022-03-06 LAB — PROTEIN / CREATININE RATIO, URINE
Creatinine, Urine: 88.37 mg/dL
Protein Creatinine Ratio: 0.85 mg/mg{Cre} — ABNORMAL HIGH (ref 0.00–0.15)
Total Protein, Urine: 75 mg/dL

## 2022-03-06 MED ORDER — LACTATED RINGERS IV SOLN: INTRAVENOUS | Status: DC

## 2022-03-06 MED ORDER — LABETALOL HCL 5 MG/ML IV SOLN
INTRAVENOUS | Status: AC
  Administered 2022-03-07: 40 mg via INTRAVENOUS
  Filled 2022-03-06: qty 4

## 2022-03-06 MED ORDER — LABETALOL HCL 5 MG/ML IV SOLN: 80.0000 mg | INTRAVENOUS | Status: DC | PRN

## 2022-03-06 MED ORDER — MAGNESIUM SULFATE 40 GM/1000ML IV SOLN: 2.0000 g/h | INTRAVENOUS | Status: DC

## 2022-03-06 MED ORDER — LABETALOL HCL 5 MG/ML IV SOLN
40.0000 mg | INTRAVENOUS | Status: DC | PRN
  Filled 2022-03-06: qty 8

## 2022-03-06 MED ORDER — LABETALOL HCL 5 MG/ML IV SOLN
20.0000 mg | INTRAVENOUS | Status: DC | PRN
  Administered 2022-03-06: 20 mg via INTRAVENOUS

## 2022-03-06 MED ORDER — HYDRALAZINE HCL 20 MG/ML IJ SOLN: 10.0000 mg | INTRAMUSCULAR | Status: DC | PRN

## 2022-03-06 MED ORDER — MAGNESIUM SULFATE BOLUS VIA INFUSION
4.0000 g | Freq: Once | INTRAVENOUS | Status: DC
  Filled 2022-03-06: qty 1000

## 2022-03-06 NOTE — Progress Notes (Signed)
Talked with patient at bedside. Patient voiced that she would like to go home and return in the morning for admission. Discussed that I do not recommend she leave to hospital due to her severe range blood pressures and headache, indicative of severe pre-eclampsia. Discussed need for magnesium infusion to prevent complications and seizures. Patient asked if this can be managed at home. I emphasized that this cannot be managed at home and that I strongly recommend admission for further treatment and IOL. Patient voiced she would talk to her husband and then come to a decision. Will reassess.   Evalina Field, MD

## 2022-03-06 NOTE — MAU Note (Signed)
Pt told CNM that she did not want to stay to be induced due to child care. Wants to come back in the morning. Notifed Dr. Mathis Fare and she will come talk to pt again.

## 2022-03-06 NOTE — MAU Provider Note (Signed)
Karen Burke [id number] Mar 26, 1986  Karen Burke is a 36 year [phone] at 36.5 weeks with SIUP. Patient presented with c/o elevated blood pressure at office yesterday.  She was instructed to report for evaluation, but did not show.  Today patient reports taking her BP, at home, and it was elevated.  She also reports HA and occasional ctx without pain. Patient endorses fetal movement and denies vaginal concerns.    Vitals:   03/06/22 2200 03/06/22 2215 03/06/22 2230 03/06/22 2245  BP: (!) 165/93 (!) 165/97 (!) 158/96 (!) 158/93   03/06/22 2305 03/06/22 2315 03/06/22 2330 03/06/22 2341  BP: (!) 162/93 (!) 144/87 (!) 169/97 (!) 146/93   03/06/22 2345 03/07/22 0010 03/07/22 0030 03/07/22 0045  BP: (!) 145/88 (!) 155/96 (!) 162/92 (!) 173/104   Physical Exam Vitals reviewed.  Constitutional:      General: She is not in acute distress.    Appearance: Normal appearance.  HENT:     Head: Normocephalic and atraumatic.  Eyes:     Conjunctiva/sclera: Conjunctivae normal.  Cardiovascular:     Rate and Rhythm: Normal rate.  Pulmonary:     Effort: Pulmonary effort is normal.  Abdominal:     Comments: Gravid, Appears AGA  Musculoskeletal:     Cervical back: Normal range of motion.  Skin:    General: Skin is warm and dry.  Neurological:     Mental Status: She is alert and oriented to person, place, and time.  Psychiatric:        Mood and Affect: Mood normal.   FHT:145 bpm, Mod Var, -Decels, +15x15 Accels Toco: No ctx graphed  36 year old [id number] SIUP at 36.5 weeks  Cat I FT PreEclampsia with SF AMA   -Upon arrival nurse reports patient with severe blood pressure x 2.   -Labetalol protocol ordered. -POC reviewed per MFM MD, Grace Bushy, recommendation of delivery if setting of abnormal labs or blood pressures suggesting severe features.  -L&D team notified of pending admission d/t MFM recommendation. -Provider to bedside to discuss recommendation and patient husband reports that  patient is unable to stay tonight.  -Husband questions why patient can not be started on medications and plan for delivery at a later time.   -Educated on PreEclampsia and that symptoms patient presenting with today are concerning for severe status.  -Further reviewed MFM role and recommendation in antenatal care process.   -Provider with strong recommendation for admission for initiation of induction process as well as magnesium sulfate bolus f/b infusion for seizure prophylaxis.   -Discussed, despite husband interruption, what magnesium is and why it is recommended in this setting.  -After discussion patient and husband reports desire for discharge. -Informed that they would have to leave AMA and sign relevant paperwork.  -The patient and her family had the opportunity to ask questions about her medical condition. -Addressed all questions and concerns of patient and family regarding decision for AMA leave including: *The benefits of continued care was, including the availability and proximity of nurses, physicians, monitoring, testing, and treatment. *The risks of worsening condition, including preterm labor and delivery that could potentially result in death of her infant -My physical assessment, of the patient showed, that she is of normal mental status and full capacity to make medical decisions. -The patient was treated to the extent that she would allow and knows that she may return for care at any time. -Nurse informed of patient desire for completion of AMA paperwork. -Patient and husband states they will  return in [facility] and reassured that they can return at any time for immediate admission. -L&D team notified of patient pending leave. States will report to bedside to address questions/concerns further.     Cherre Robins MSN, CNM Advanced Practice Provider, Center for [facility] 03/06/2022

## 2022-03-06 NOTE — MAU Note (Signed)
Dr. Mathis Fare talked to pat again about staying and stated she would have to sign out AMA . Pt is talking with husband will let us know her decision in a few minutes.

## 2022-03-06 NOTE — MAU Note (Signed)
.  Karen Burke is a 36 y.o. at [redacted]w[redacted]d here in MAU reporting high blood pressure yesterday at MFM. Was told to come to hospital. See took her b/p today and it was high. Has occ abd pain with ctx but no pain.  Denies VB or LOF. Does report decreased FM today Onset of complaint: yesterday Pain score: 0 Vitals:   03/06/22 2116 03/06/22 2120  BP:  (!) 154/101  Pulse: 87   Resp: 17   Temp: 98.4 F (36.9 C)   SpO2: 99%      FHT:143 Lab orders placed from triage:  u/a

## 2022-03-07 ENCOUNTER — Encounter (HOSPITAL_COMMUNITY): Payer: Self-pay | Admitting: Obstetrics and Gynecology

## 2022-03-07 ENCOUNTER — Inpatient Hospital Stay (HOSPITAL_COMMUNITY)
Admission: AD | Admit: 2022-03-07 | Discharge: 2022-03-11 | DRG: 768 | Disposition: A | Discharge status: Home / Self Care | Payer: Managed Care, Other (non HMO) | Attending: Family Medicine | Admitting: Family Medicine

## 2022-03-07 DIAGNOSIS — O99824 Streptococcus B carrier state complicating childbirth: Secondary | ICD-10-CM | POA: Diagnosis present

## 2022-03-07 DIAGNOSIS — R8271 Bacteriuria: Secondary | ICD-10-CM | POA: Diagnosis present

## 2022-03-07 DIAGNOSIS — Z789 Other specified health status: Secondary | ICD-10-CM | POA: Diagnosis present

## 2022-03-07 DIAGNOSIS — Z3A36 36 weeks gestation of pregnancy: Secondary | ICD-10-CM | POA: Diagnosis not present

## 2022-03-07 DIAGNOSIS — O1414 Severe pre-eclampsia complicating childbirth: Secondary | ICD-10-CM | POA: Diagnosis present

## 2022-03-07 DIAGNOSIS — O1413 Severe pre-eclampsia, third trimester: Secondary | ICD-10-CM

## 2022-03-07 DIAGNOSIS — O99354 Diseases of the nervous system complicating childbirth: Secondary | ICD-10-CM | POA: Diagnosis present

## 2022-03-07 DIAGNOSIS — D563 Thalassemia minor: Secondary | ICD-10-CM | POA: Diagnosis present

## 2022-03-07 DIAGNOSIS — G4733 Obstructive sleep apnea (adult) (pediatric): Secondary | ICD-10-CM | POA: Diagnosis present

## 2022-03-07 DIAGNOSIS — O99820 Streptococcus B carrier state complicating pregnancy: Secondary | ICD-10-CM | POA: Diagnosis not present

## 2022-03-07 DIAGNOSIS — O9081 Anemia of the puerperium: Secondary | ICD-10-CM | POA: Diagnosis not present

## 2022-03-07 DIAGNOSIS — O1410 Severe pre-eclampsia, unspecified trimester: Secondary | ICD-10-CM | POA: Diagnosis present

## 2022-03-07 LAB — COMPREHENSIVE METABOLIC PANEL
ALT: 14 U/L (ref 0–44)
AST: 22 U/L (ref 15–41)
Albumin: 2.4 g/dL — ABNORMAL LOW (ref 3.5–5.0)
Alkaline Phosphatase: 156 U/L — ABNORMAL HIGH (ref 38–126)
Anion gap: 10 (ref 5–15)
BUN: 7 mg/dL (ref 6–20)
CO2: 24 mmol/L (ref 22–32)
Calcium: 9.1 mg/dL (ref 8.9–10.3)
Chloride: 102 mmol/L (ref 98–111)
Creatinine, Ser: 0.85 mg/dL (ref 0.44–1.00)
GFR, Estimated: >60 mL/min (ref >60)
Glucose, Bld: 87 mg/dL (ref 70–99)
Potassium: 3.8 mmol/L (ref 3.5–5.1)
Sodium: 136 mmol/L (ref 135–145)
Total Bilirubin: 0.1 mg/dL — ABNORMAL LOW (ref 0.3–1.2)
Total Protein: 6.6 g/dL (ref 6.5–8.1)

## 2022-03-07 LAB — CBC
HCT: 29.6 % — ABNORMAL LOW (ref 36.0–46.0)
Hemoglobin: 9.5 g/dL — ABNORMAL LOW (ref 12.0–15.0)
MCH: 26.1 pg (ref 26.0–34.0)
MCHC: 32.1 g/dL (ref 30.0–36.0)
MCV: 81.3 fL (ref 80.0–100.0)
Platelets: 220 10*3/uL (ref 150–400)
RBC: 3.64 MIL/uL — ABNORMAL LOW (ref 3.87–5.11)
RDW: 18.6 % — ABNORMAL HIGH (ref 11.5–15.5)
WBC: 7.6 10*3/uL (ref 4.0–10.5)
nRBC: 0 % (ref 0.0–0.2)

## 2022-03-07 LAB — TYPE AND SCREEN
ABO/RH(D): O POS
Antibody Screen: NEGATIVE

## 2022-03-07 LAB — RPR: RPR Ser Ql: NONREACTIVE

## 2022-03-07 MED ORDER — LACTATED RINGERS IV SOLN: 500.0000 mL | INTRAVENOUS | Status: DC | PRN

## 2022-03-07 MED ORDER — SOD CITRATE-CITRIC ACID 500-334 MG/5ML PO SOLN: 30.0000 mL | ORAL | Status: DC | PRN

## 2022-03-07 MED ORDER — LACTATED RINGERS IV SOLN: INTRAVENOUS | Status: DC

## 2022-03-07 MED ORDER — OXYTOCIN BOLUS FROM INFUSION: 333.0000 mL | Freq: Once | INTRAVENOUS | Status: DC

## 2022-03-07 MED ORDER — MAGNESIUM SULFATE BOLUS VIA INFUSION
4.0000 g | Freq: Once | INTRAVENOUS | Status: AC
  Administered 2022-03-07: 4 g via INTRAVENOUS
  Filled 2022-03-07: qty 1000

## 2022-03-07 MED ORDER — OXYCODONE-ACETAMINOPHEN 5-325 MG PO TABS: 2.0000 | ORAL_TABLET | ORAL | Status: DC | PRN

## 2022-03-07 MED ORDER — OXYCODONE-ACETAMINOPHEN 5-325 MG PO TABS: 1.0000 | ORAL_TABLET | ORAL | Status: DC | PRN

## 2022-03-07 MED ORDER — SODIUM CHLORIDE 0.9 % IV SOLN
5.0000 10*6.[IU] | Freq: Once | INTRAVENOUS | Status: AC
  Administered 2022-03-07: 5 10*6.[IU] via INTRAVENOUS
  Filled 2022-03-07: qty 5

## 2022-03-07 MED ORDER — HYDRALAZINE HCL 20 MG/ML IJ SOLN: 10.0000 mg | INTRAMUSCULAR | Status: DC | PRN

## 2022-03-07 MED ORDER — TERBUTALINE SULFATE 1 MG/ML IJ SOLN: 0.2500 mg | Freq: Once | INTRAMUSCULAR | Status: DC | PRN

## 2022-03-07 MED ORDER — ACETAMINOPHEN 325 MG PO TABS
650.0000 mg | ORAL_TABLET | ORAL | Status: DC | PRN
Start: 2022-03-07 — End: 2022-03-09
  Administered 2022-03-08 – 2022-03-09 (×5): 650 mg via ORAL
  Filled 2022-03-07 (×5): qty 2

## 2022-03-07 MED ORDER — LIDOCAINE HCL (PF) 1 % IJ SOLN
30.0000 mL | INTRAMUSCULAR | Status: AC | PRN
Start: 2022-03-07 — End: 2022-03-09
  Administered 2022-03-09: 30 mL via SUBCUTANEOUS
  Filled 2022-03-07: qty 30

## 2022-03-07 MED ORDER — OXYTOCIN-SODIUM CHLORIDE 30-0.9 UT/500ML-% IV SOLN
2.5000 [IU]/h | INTRAVENOUS | Status: DC
  Administered 2022-03-09: 2.5 [IU]/h via INTRAVENOUS
  Filled 2022-03-07: qty 500

## 2022-03-07 MED ORDER — LACTATED RINGERS IV SOLN
INTRAVENOUS | Status: DC
  Administered 2022-03-07: 600 mL via INTRAVENOUS
  Administered 2022-03-08: 1000 mL via INTRAVENOUS

## 2022-03-07 MED ORDER — MAGNESIUM SULFATE 40 GM/1000ML IV SOLN
2.0000 g/h | INTRAVENOUS | Status: DC
  Administered 2022-03-07 – 2022-03-09 (×3): 2 g/h via INTRAVENOUS
  Filled 2022-03-07 (×3): qty 1000

## 2022-03-07 MED ORDER — LABETALOL HCL 5 MG/ML IV SOLN: 80.0000 mg | INTRAVENOUS | Status: DC | PRN

## 2022-03-07 MED ORDER — MISOPROSTOL 50MCG HALF TABLET
50.0000 ug | ORAL_TABLET | ORAL | Status: DC | PRN
  Administered 2022-03-07: 50 ug via BUCCAL
  Filled 2022-03-07: qty 1

## 2022-03-07 MED ORDER — LABETALOL HCL 5 MG/ML IV SOLN
40.0000 mg | INTRAVENOUS | Status: DC | PRN
  Administered 2022-03-08: 40 mg via INTRAVENOUS
  Filled 2022-03-07: qty 8

## 2022-03-07 MED ORDER — ONDANSETRON HCL 4 MG/2ML IJ SOLN: 4.0000 mg | Freq: Four times a day (QID) | INTRAMUSCULAR | Status: DC | PRN

## 2022-03-07 MED ORDER — PENICILLIN G POT IN DEXTROSE 60000 UNIT/ML IV SOLN
3.0000 10*6.[IU] | INTRAVENOUS | Status: DC
  Administered 2022-03-07 – 2022-03-09 (×8): 3 10*6.[IU] via INTRAVENOUS
  Filled 2022-03-07 (×9): qty 50

## 2022-03-07 MED ORDER — FENTANYL CITRATE (PF) 100 MCG/2ML IJ SOLN
50.0000 ug | INTRAMUSCULAR | Status: DC | PRN
  Administered 2022-03-08: 50 ug via INTRAVENOUS
  Administered 2022-03-09 (×3): 100 ug via INTRAVENOUS
  Filled 2022-03-07 (×4): qty 2

## 2022-03-07 MED ORDER — LABETALOL HCL 5 MG/ML IV SOLN
20.0000 mg | INTRAVENOUS | Status: DC | PRN
  Administered 2022-03-07 – 2022-03-08 (×4): 20 mg via INTRAVENOUS
  Filled 2022-03-07 (×4): qty 4

## 2022-03-07 NOTE — MAU Note (Signed)
Karen Burke is a 36 y.o. at [redacted]w[redacted]d here in MAU reporting: was here yesterday and was advised to stay for IOL, pt left AMA. Pt has returned because BP has remained high and plans IOL today. Endorses a headache. No visual changes or RUQ pain. +FM. No bleeding or LOF.  Onset of complaint: ongoing  Pain score: 5/10  Vitals:   03/07/22 1845  BP: (!) 155/97  Pulse: 92  Resp: 16  Temp: 98.9 F (37.2 C)  SpO2: 99%     FHT:162  Lab orders placed from triage: UA

## 2022-03-07 NOTE — MAU Note (Signed)
After discussing with her husband and Dr. Mathis Fare again. Pt decided to sign out AMA and return tomorrow for induction. Notified Dr. Mathis Fare of pt decision.

## 2022-03-07 NOTE — H&P (Shared)
OBSTETRIC ADMISSION HISTORY AND PHYSICAL  Karen Burke is a 36 y.o. female [phone] with IUP at 1045w5d by LMP presenting for IOL due to pre-eclampsia with severe features. She reports a headache but denies vision changes or RUQ pain. She reports + FM, though decreased somewhat from normal. Denies LOF. Has had some occasional contractions.   She plans on breast feeding. She requests *** for birth control postpartum.  She received her prenatal care [facility] [place].   Dating: By LMP --->  Estimated Date of Delivery: 03/30/22  Sono:   @[redacted]w[redacted]d , CWD, normal anatomy, transverse presentation, anterior placental lie, 2700 g, 55% EFW  Prenatal History/Complications:  Maternal obesity (BMI 43) Alpha thalassemia carrier  GBS bacteruria  OSA  Past Medical History: Past Medical History:  Diagnosis Date   Anemia    ASCUS with positive high risk HPV    Pyelonephritis affecting pregnancy in second trimester 03/11/2016   Vaginal Pap smear, abnormal     Past Surgical History: Past Surgical History:  Procedure Laterality Date   COLPOSCOPY     cyst removal arm      Obstetrical History: OB History     Gravida  6   Para  2   Term  2   Preterm      AB  3   Living  2      SAB  2   IAB      Ectopic      Multiple  0   Live Births  2           Social History Social History   Socioeconomic History   Marital status: Married    Spouse name: Not on file   Number of children: Not on file   Years of education: Not on file   Highest education level: Not on file  Occupational History   Not on file  Tobacco Use   Smoking status: Never   Smokeless tobacco: Never  Vaping Use   Vaping Use: Never used  Substance and Sexual Activity   Alcohol use: No   Drug use: No   Sexual activity: Yes    Birth control/protection: None  Other Topics Concern   Not on file  Social History Narrative   Not on file   Social Determinants of Health   Financial Resource Strain: Not on  file  Food Insecurity: Not on file  Transportation Needs: Not on file  Physical Activity: Not on file  Stress: Not on file  Social Connections: Not on file    Family History: Family History  Problem Relation Age of Onset   Hypertension Mother    Asthma Maternal Uncle     Allergies: No Known Allergies  Medications Prior to Admission  Medication Sig Dispense Refill Last Dose   Prenatal Vit-Fe Fumarate-FA (PRENATAL MULTIVITAMIN) TABS tablet Take 1 tablet by mouth daily at 12 noon. (Patient not taking: Reported on 02/25/2022) 90 tablet 3    terconazole (TERAZOL 7) 0.4 % vaginal cream Place 1 applicator vaginally at bedtime. 45 g 0      Review of Systems  All systems reviewed and negative except as stated in HPI  Blood pressure (!) 155/96, pulse 87, temperature 98.4 F (36.9 C), resp. rate 17, height 5' (1.524 m), weight 100.7 kg, last menstrual period 06/23/2021, SpO2 99 %, unknown if currently breastfeeding.  General appearance: alert, cooperative, and no distress Lungs: normal work of breathing on room air  Heart: normal rate, warm and well perfused  Abdomen: soft, non-tender, gravid  Extremities: no LE edema or calf tenderness to palpation   Presentation: {desc; fetal presentation:14558} Fetal monitoring: Baseline 140 bpm, moderate variability, + accels, no decels  Uterine activity: None     Prenatal labs: ABO, Rh: --/--/O POS (06/03 2205) Antibody: NEG (06/03 2205) Rubella: 9.88 (01/10 1100) RPR: Non Reactive (04/04 1146)  HBsAg: Negative (01/10 1100)  HIV: Non Reactive (04/04 1146)  GBS:  Positive  1 hr Glucola normal  Genetic screening - LR NIPS, alpha thalassemia carrier  Anatomy US normal   Prenatal Transfer Tool  Maternal Diabetes: No Genetic Screening: As above  Maternal Ultrasounds/Referrals: Normal Fetal Ultrasounds or other Referrals:  None Maternal Substance Abuse:  No Significant Maternal Medications:  None Significant Maternal Lab Results: Group  B Strep positive  Results for orders placed or performed during the hospital encounter of 03/06/22 (from the past 24 hour(s))  Urinalysis, Routine w reflex microscopic Urine, Clean Catch   Collection Time: 03/06/22  9:30 PM  Result Value Ref Range   Color, Urine YELLOW YELLOW   APPearance HAZY (A) CLEAR   Specific Gravity, Urine 1.012 1.005 - 1.030   pH 7.0 5.0 - 8.0   Glucose, UA NEGATIVE NEGATIVE mg/dL   Hgb urine dipstick NEGATIVE NEGATIVE   Bilirubin Urine NEGATIVE NEGATIVE   Ketones, ur NEGATIVE NEGATIVE mg/dL   Protein, ur 30 (A) NEGATIVE mg/dL   Nitrite NEGATIVE NEGATIVE   Leukocytes,Ua NEGATIVE NEGATIVE   RBC / HPF 0-5 0 - 5 RBC/hpf   WBC, UA 0-5 0 - 5 WBC/hpf   Bacteria, UA NONE SEEN NONE SEEN   Squamous Epithelial / LPF 21-50 0 - 5   Mucus PRESENT   Protein / creatinine ratio, urine   Collection Time: 03/06/22  9:30 PM  Result Value Ref Range   Creatinine, Urine 88.37 mg/dL   Total Protein, Urine 75 mg/dL   Protein Creatinine Ratio 0.85 (H) 0.00 - 0.15 mg/mg[Cre]  CBC   Collection Time: 03/06/22 10:05 PM  Result Value Ref Range   WBC 6.9 4.0 - 10.5 K/uL   RBC 3.81 (L) 3.87 - 5.11 MIL/uL   Hemoglobin 9.8 (L) 12.0 - 15.0 g/dL   HCT 68.0 (L) 32.1 - 22.4 %   MCV 80.8 80.0 - 100.0 fL   MCH 25.7 (L) 26.0 - 34.0 pg   MCHC 31.8 30.0 - 36.0 g/dL   RDW 82.5 (H) 00.3 - 70.4 %   Platelets 220 150 - 400 K/uL   nRBC 0.0 0.0 - 0.2 %  Comprehensive metabolic panel   Collection Time: 03/06/22 10:05 PM  Result Value Ref Range   Sodium 135 135 - 145 mmol/L   Potassium 3.9 3.5 - 5.1 mmol/L   Chloride 103 98 - 111 mmol/L   CO2 24 22 - 32 mmol/L   Glucose, Bld 97 70 - 99 mg/dL   BUN 5 (L) 6 - 20 mg/dL   Creatinine, Ser 8.88 0.44 - 1.00 mg/dL   Calcium 9.2 8.9 - 91.6 mg/dL   Total Protein 6.5 6.5 - 8.1 g/dL   Albumin 2.4 (L) 3.5 - 5.0 g/dL   AST 17 15 - 41 U/L   ALT 12 0 - 44 U/L   Alkaline Phosphatase 157 (H) 38 - 126 U/L   Total Bilirubin 0.3 0.3 - 1.2 mg/dL   GFR,  Estimated >94 >50 mL/min   Anion gap 8 5 - 15  Type and screen [facility]   Collection Time: 03/06/22 10:05 PM  Result Value Ref Range   ABO/RH(D) O POS    Antibody Screen NEG    Sample Expiration      03/09/2022,2359 Performed at [facility], [address], [place], [place] [address]     Patient Active Problem List   Diagnosis Date Noted   Alpha thalassemia silent carrier 11/07/2021   Supervision of other normal pregnancy, antepartum 10/13/2021   Obesity (BMI 30-39.9) 08/24/2019   OSA (obstructive sleep apnea) 08/24/2019   GBS bacteriuria 02/22/2016   Language barrier 12/17/2014    Assessment/Plan:  Madisin Hasan is a 36 y.o. [id number] at [redacted]w[redacted]d here for IOL due to pre-eclampsia with severe features.   #Labor: *** #Pain: PRN #FWB: Cat 1, reactive  #ID:  GBS pos; PCN ordered  #MOF: Breast  #MOC: ***  #Pre-eclampsia with severe features: Based on severe range BP and headache. LFTs and platelets within normal limits. Urine P:C ratio 0.85. Started on magnesium. Will repeat labs in 12 hours. Continue to treat BP per protocol.   Worthy Rancher, MD  03/07/2022, 12:28 AM

## 2022-03-07 NOTE — H&P (Addendum)
OBSTETRIC ADMISSION HISTORY AND PHYSICAL  Karen Burke is a 36 y.o. female [phone] with IUP at 156w5d by LMP presenting for IOL due to pre-eclampsia with severe features. She reports +FMs, No LOF, no VB, no blurry vision, or RUQ pain. She reports her last headache was at 5PM. She reports some swelling of lower extremities. She plans on breast feeding. She plans to use condoms for birth control.   Patient presented to the MAU yesterday due to elevated BP at the office. Dr. Mathis FareAlbert strongly recommended admission for management of severe pre-eclampsia and IOL but patient left AMA.    U/S at bedside confirmed cephalic presentation.  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  GBS bacteruria  OSA    Past Medical History: Past Medical History:  Diagnosis Date   Anemia    ASCUS with positive high risk HPV    Pyelonephritis affecting pregnancy in second trimester 03/11/2016   Vaginal Pap smear, abnormal     Past Surgical History: Past Surgical History:  Procedure Laterality Date   COLPOSCOPY     cyst removal arm      Obstetrical History: OB History     Gravida  6   Para  2   Term  2   Preterm      AB  3   Living  2      SAB  2   IAB      Ectopic      Multiple  0   Live Births  2           Social History Social History   Socioeconomic History   Marital status: Married    Spouse name: Not on file   Number of children: Not on file   Years of education: Not on file   Highest education level: Not on file  Occupational History   Not on file  Tobacco Use   Smoking status: Never   Smokeless tobacco: Never  Vaping Use   Vaping Use: Never used  Substance and Sexual Activity   Alcohol use: No   Drug  use: No   Sexual activity: Yes    Birth control/protection: None  Other Topics Concern   Not on file  Social History Narrative   Not on file   Social Determinants of Health   Financial Resource Strain: Not on file  Food Insecurity: Not on file  Transportation Needs: Not on file  Physical Activity: Not on file  Stress: Not on file  Social Connections: Not on file    Family History: Family History  Problem Relation Age of Onset   Hypertension Mother    Asthma Maternal Uncle     Allergies: Not on File  Medications Prior to Admission  Medication Sig Dispense Refill Last Dose   terconazole (TERAZOL 7) 0.4 % vaginal cream Place 1 applicator vaginally at bedtime. 45 g 0 Past Week   Prenatal Vit-Fe Fumarate-FA (PRENATAL MULTIVITAMIN) TABS tablet Take 1 tablet by mouth daily at 12 noon. (Patient not taking: Reported on 02/25/2022) 90 tablet 3      Review of Systems   All systems reviewed and negative except as stated in HPI  Blood pressure (!) 161/89, pulse 91, temperature 98.9 F (37.2 C), temperature source  Oral, resp. rate 18, height 5' (1.524 m), weight 100 kg, last menstrual period 06/23/2021, SpO2 99 %, unknown if currently breastfeeding. General appearance: alert, cooperative, and no distress Lungs: clear to auscultation bilaterally Heart: regular rate and rhythm Abdomen: soft, non-tender; bowel sounds normal Pelvic: Normal  Extremities: Homans sign is negative, no sign of DVT. 2+ pitting edema bilaterally in lower extremities.  DTR's intact Presentation: cephalic Fetal monitoringBaseline: 140 bpm, Variability: Good {> 6 bpm), Accelerations: Reactive, and Decelerations: Absent Uterine activityNone Dilation: Closed Exam by:: Camelia Eng, CNM   Prenatal labs: ABO, Rh: --/--/O POS (06/04 1914) Antibody: NEG (06/04 1914) Rubella: 9.88 (01/10 1100) RPR: NON REACTIVE (06/03 2205)  HBsAg: Negative (01/10 1100)  HIV: Non Reactive (04/04 1146)  GBS:    1 hr  Glucola normal Genetic screening - LR NIPS, alpha thalassemia carrier Anatomy US normal  Prenatal Transfer Tool  Maternal Diabetes: No Genetic Screening: As above Maternal Ultrasounds/Referrals: Normal Fetal Ultrasounds or other Referrals:  None Maternal Substance Abuse:  No Significant Maternal Medications:  None Significant Maternal Lab Results: Group B Strep positive  Results for orders placed or performed during the hospital encounter of 03/07/22 (from the past 24 hour(s))  Comprehensive metabolic panel   Collection Time: 03/07/22  7:01 PM  Result Value Ref Range   Sodium 136 135 - 145 mmol/L   Potassium 3.8 3.5 - 5.1 mmol/L   Chloride 102 98 - 111 mmol/L   CO2 24 22 - 32 mmol/L   Glucose, Bld 87 70 - 99 mg/dL   BUN 7 6 - 20 mg/dL   Creatinine, Ser 8.31 0.44 - 1.00 mg/dL   Calcium 9.1 8.9 - 51.7 mg/dL   Total Protein 6.6 6.5 - 8.1 g/dL   Albumin 2.4 (L) 3.5 - 5.0 g/dL   AST 22 15 - 41 U/L   ALT 14 0 - 44 U/L   Alkaline Phosphatase 156 (H) 38 - 126 U/L   Total Bilirubin 0.1 (L) 0.3 - 1.2 mg/dL   GFR, Estimated >61 >60 mL/min   Anion gap 10 5 - 15  CBC   Collection Time: 03/07/22  7:01 PM  Result Value Ref Range   WBC 7.6 4.0 - 10.5 K/uL   RBC 3.64 (L) 3.87 - 5.11 MIL/uL   Hemoglobin 9.5 (L) 12.0 - 15.0 g/dL   HCT 73.7 (L) 10.6 - 26.9 %   MCV 81.3 80.0 - 100.0 fL   MCH 26.1 26.0 - 34.0 pg   MCHC 32.1 30.0 - 36.0 g/dL   RDW 48.5 (H) 46.2 - 70.3 %   Platelets 220 150 - 400 K/uL   nRBC 0.0 0.0 - 0.2 %  Type and screen [facility]   Collection Time: 03/07/22  7:14 PM  Result Value Ref Range   ABO/RH(D) O POS    Antibody Screen NEG    Sample Expiration      03/10/2022,2359 Performed at [facility], [address] [place], [place] [address]   Results for orders placed or performed during the hospital encounter of 03/06/22 (from the past 24 hour(s))  CBC   Collection Time: 03/06/22 10:05 PM  Result Value Ref Range   WBC 6.9 4.0 - 10.5  K/uL   RBC 3.81 (L) 3.87 - 5.11 MIL/uL   Hemoglobin 9.8 (L) 12.0 - 15.0 g/dL   HCT 81.8 (L) 29.9 - 37.1 %   MCV 80.8 80.0 - 100.0 fL   MCH 25.7 (L) 26.0 - 34.0 pg  MCHC 31.8 30.0 - 36.0 g/dL   RDW 98.9 (H) 21.1 - 94.1 %   Platelets 220 150 - 400 K/uL   nRBC 0.0 0.0 - 0.2 %  Comprehensive metabolic panel   Collection Time: 03/06/22 10:05 PM  Result Value Ref Range   Sodium 135 135 - 145 mmol/L   Potassium 3.9 3.5 - 5.1 mmol/L   Chloride 103 98 - 111 mmol/L   CO2 24 22 - 32 mmol/L   Glucose, Bld 97 70 - 99 mg/dL   BUN 5 (L) 6 - 20 mg/dL   Creatinine, Ser 7.40 0.44 - 1.00 mg/dL   Calcium 9.2 8.9 - 81.4 mg/dL   Total Protein 6.5 6.5 - 8.1 g/dL   Albumin 2.4 (L) 3.5 - 5.0 g/dL   AST 17 15 - 41 U/L   ALT 12 0 - 44 U/L   Alkaline Phosphatase 157 (H) 38 - 126 U/L   Total Bilirubin 0.3 0.3 - 1.2 mg/dL   GFR, Estimated >48 >18 mL/min   Anion gap 8 5 - 15  RPR   Collection Time: 03/06/22 10:05 PM  Result Value Ref Range   RPR Ser Ql NON REACTIVE NON REACTIVE  Type and screen [facility]   Collection Time: 03/06/22 10:05 PM  Result Value Ref Range   ABO/RH(D) O POS    Antibody Screen NEG    Sample Expiration      03/09/2022,2359 Performed at [facility], [address], [place], [place] [address]     Patient Active Problem List   Diagnosis Date Noted   Severe preeclampsia 03/07/2022   Alpha thalassemia silent carrier 11/07/2021   Supervision of other normal pregnancy, antepartum 10/13/2021   Obesity (BMI 30-39.9) 08/24/2019   OSA (obstructive sleep apnea) 08/24/2019   GBS bacteriuria 02/22/2016   Language barrier 12/17/2014    Assessment/Plan:  Karen Burke is a 36 y.o. [id number] at [redacted]w[redacted]d here for IOL for preeclampsia with severe features.  #Labor: Started cytotec at 2145. Discussed using foley balloon and pitocin when appropriate, patient was agreeable. #Pre-Eclampsia: Bps have been in the 150-160s/90s, started magnesium.   #Pain: Patient wants  IV Fentanyl for contractions #FWB: Cat 1, reactive #ID: GBS +, PCN ordered #MOF: Breast #MOC: Condoms   Charolett Bumpers, Medical Student  03/07/2022, 9:43 PM   Attestation of CNM Supervision of Student: Evaluation and management procedures were performed by the student under my supervision. I was immediately available for direct supervision, assistance and direction throughout this encounter.  I also confirm that I have verified the information documented in the resident's note, and that I have also personally reperformed the pertinent components of the physical exam and all of the medical decision making activities.  I have also made any necessary editorial changes.  Brand Males, CNM 03/07/2022 10:06 PM

## 2022-03-07 NOTE — Progress Notes (Signed)
Returned to talk with patient's husband regarding her condition and recommendation for admission. Again discussed that I recommend admission for treatment of severe pre-eclampsia and IOL. Discussed importance of treatment with magnesium to prevent seizures and BP treatment to prevent complications such as stroke. Patient's husband voiced understanding but stated that she did not have any of her things and needed to figure out child care arrangements. Offered understanding; however, stressed that she should be admitted, even if it means that he care for her children until they can figure out alternative arrangements. Discussed that if she wanted to leave at this point, it would be strongly against medical advice. Discussed that leaving AMA means that they acknowledge that she could have a complication at home such as a seizure, stroke, and even death. Patient's husband voiced understanding. They will think about this further and come to a decision.   Vilma Meckel, MD

## 2022-03-08 LAB — CBC
HCT: 31.8 % — ABNORMAL LOW (ref 36.0–46.0)
HCT: 31.8 % — ABNORMAL LOW (ref 36.0–46.0)
Hemoglobin: 10.3 g/dL — ABNORMAL LOW (ref 12.0–15.0)
Hemoglobin: 10.2 g/dL — ABNORMAL LOW (ref 12.0–15.0)
MCH: 25.9 pg — ABNORMAL LOW (ref 26.0–34.0)
MCH: 25.5 pg — ABNORMAL LOW (ref 26.0–34.0)
MCHC: 32.4 g/dL (ref 30.0–36.0)
MCHC: 32.1 g/dL (ref 30.0–36.0)
MCV: 79.9 fL — ABNORMAL LOW (ref 80.0–100.0)
MCV: 79.5 fL — ABNORMAL LOW (ref 80.0–100.0)
Platelets: 244 10*3/uL (ref 150–400)
Platelets: 247 10*3/uL (ref 150–400)
RBC: 3.98 MIL/uL (ref 3.87–5.11)
RBC: 4 MIL/uL (ref 3.87–5.11)
RDW: 18.4 % — ABNORMAL HIGH (ref 11.5–15.5)
RDW: 18.4 % — ABNORMAL HIGH (ref 11.5–15.5)
WBC: 7.4 10*3/uL (ref 4.0–10.5)
WBC: 8.2 10*3/uL (ref 4.0–10.5)
nRBC: 0 % (ref 0.0–0.2)
nRBC: 0 % (ref 0.0–0.2)

## 2022-03-08 LAB — COMPREHENSIVE METABOLIC PANEL
ALT: 10 U/L (ref 0–44)
ALT: 11 U/L (ref 0–44)
AST: 17 U/L (ref 15–41)
AST: 17 U/L (ref 15–41)
Albumin: 2.3 g/dL — ABNORMAL LOW (ref 3.5–5.0)
Albumin: 2.3 g/dL — ABNORMAL LOW (ref 3.5–5.0)
Alkaline Phosphatase: 159 U/L — ABNORMAL HIGH (ref 38–126)
Alkaline Phosphatase: 164 U/L — ABNORMAL HIGH (ref 38–126)
Anion gap: 5 (ref 5–15)
Anion gap: 11 (ref 5–15)
BUN: 5 mg/dL — ABNORMAL LOW (ref 6–20)
BUN: <5 mg/dL — ABNORMAL LOW (ref 6–20)
CO2: 23 mmol/L (ref 22–32)
CO2: 20 mmol/L — ABNORMAL LOW (ref 22–32)
Calcium: 8 mg/dL — ABNORMAL LOW (ref 8.9–10.3)
Calcium: 7.4 mg/dL — ABNORMAL LOW (ref 8.9–10.3)
Chloride: 103 mmol/L (ref 98–111)
Chloride: 96 mmol/L — ABNORMAL LOW (ref 98–111)
Creatinine, Ser: 0.81 mg/dL (ref 0.44–1.00)
Creatinine, Ser: 0.78 mg/dL (ref 0.44–1.00)
GFR, Estimated: >60 mL/min (ref >60)
GFR, Estimated: >60 mL/min (ref >60)
Glucose, Bld: 87 mg/dL (ref 70–99)
Glucose, Bld: 93 mg/dL (ref 70–99)
Potassium: 4.2 mmol/L (ref 3.5–5.1)
Potassium: 4.2 mmol/L (ref 3.5–5.1)
Sodium: 131 mmol/L — ABNORMAL LOW (ref 135–145)
Sodium: 127 mmol/L — ABNORMAL LOW (ref 135–145)
Total Bilirubin: 0.5 mg/dL (ref 0.3–1.2)
Total Bilirubin: 0.5 mg/dL (ref 0.3–1.2)
Total Protein: 6.3 g/dL — ABNORMAL LOW (ref 6.5–8.1)
Total Protein: 6.3 g/dL — ABNORMAL LOW (ref 6.5–8.1)

## 2022-03-08 LAB — MAGNESIUM
Magnesium: 5.8 mg/dL — ABNORMAL HIGH (ref 1.7–2.4)
Magnesium: 6.3 mg/dL (ref 1.7–2.4)

## 2022-03-08 MED ORDER — LABETALOL HCL 100 MG PO TABS
100.0000 mg | ORAL_TABLET | Freq: Two times a day (BID) | ORAL | Status: DC
  Administered 2022-03-08: 100 mg via ORAL
  Filled 2022-03-08: qty 1

## 2022-03-08 MED ORDER — OXYTOCIN-SODIUM CHLORIDE 30-0.9 UT/500ML-% IV SOLN: 1.0000 m[IU]/min | INTRAVENOUS | Status: DC

## 2022-03-08 MED ORDER — MISOPROSTOL 25 MCG QUARTER TABLET
25.0000 ug | ORAL_TABLET | ORAL | Status: DC | PRN
  Administered 2022-03-08 (×2): 25 ug via VAGINAL
  Filled 2022-03-08 (×2): qty 1

## 2022-03-08 MED ORDER — OXYTOCIN-SODIUM CHLORIDE 30-0.9 UT/500ML-% IV SOLN
INTRAVENOUS | Status: AC
  Administered 2022-03-08: 2 m[IU]/min via INTRAVENOUS
  Filled 2022-03-08: qty 500

## 2022-03-08 NOTE — Progress Notes (Signed)
Labor Progress Note Camille Dragan is a 36 y.o. [id number] at [redacted]w[redacted]d who presented for IOL due to pre-eclampsia with severe features.   S: Doing well. Continues to feel her contractions. No concerns.   O:  BP (!) 143/92   Pulse 83   Temp 97.8 F (36.6 C) (Oral)   Resp 16   Ht 5' (1.524 m)   Wt 100 kg   LMP 06/23/2021 (Approximate)   SpO2 99%   BMI 43.06 kg/m   EFM: Baseline 125 bpm, moderate variability, + accels, no decels  Toco: Every 2-5 minutes   CVE: Dilation: 4.5 Effacement (%): 50 Cervical Position: Posterior Station: -3 Presentation: Vertex Exam by:: Dr. Mathis Fare  A&P: 36 y.o. [id number] [redacted]w[redacted]d   #Labor: Progressing. Cervix slightly more effaced this check, but similar dilation and station. On Pitocin at 10 milli-Units/min. Contracting every 2-5 minutes. Offered AROM this exam, patient declined for now. She would like to wait until her contractions are stronger to break her water. Discussed that AROM would likely help her labor progress more quickly. Patient would still like to wait for now. Encouraged patient to get up and walk or use exercise ball. Will continue to titrate Pitocin and reassess in 4 hours.  #Pain: PRN #FWB: Cat 1  #GBS unknown; receiving PCN  #Pre-eclampsia with severe features: Recently treated another severe range blood pressure. Labetalol 20 mg IV given. Given that patient has been treated 3x now, will start oral Labetalol 100 mg BID to assist with BP control. Plan to repeat labs at 10 PM. Will continue to monitor.   Worthy Rancher, MD 7:22 PM

## 2022-03-08 NOTE — Progress Notes (Signed)
Labor Progress Note Panya Affeldt is a 36 y.o. [id number] at [redacted]w[redacted]d who presented for IOL due to pre-eclampsia with severe features.   S: Doing well. Feeling some cramping. No concerns at this time.   O:  BP (!) 142/86   Pulse 85   Temp 98.6 F (37 C) (Oral)   Resp 18   Ht 5' (1.524 m)   Wt 100 kg   LMP 06/23/2021 (Approximate)   SpO2 99%   BMI 43.06 kg/m   EFM: Baseline 125 bpm, moderate variability, + accles, no decels  Toco: Irregular contractions   CVE: Dilation: 1 Effacement (%): Thick Cervical Position: Posterior Station: -3 Presentation: Vertex Exam by:: Gwenlyn Perking, MD  A&P: 36 y.o. [id number] [redacted]w[redacted]d   #Labor: SVE unchanged from prior. Discussed foley balloon placement and patient verbally consented. Foley balloon placed and filled with 60 cc. Mom and baby tolerated this well. Vaginal Cytotec 25 mcg also placed. Will reassess in 4 hours.  #Pain: PRN #FWB: Cat 1  #GBS unknown; receiving PCN due to preterm status  #Pre-eclampsia with severe features: BP mild range at this time. No symptoms. Will repeat CBC, CMP and Mg level at this time. Will continue to monitor closely.   Genia Del, MD 9:44 AM

## 2022-03-08 NOTE — Progress Notes (Signed)
Karen Burke is a 36 y.o. [id number] at [redacted]w[redacted]d admitted for IOL in the setting of pre-eclampsia with severe features.  Subjective: Patient is doing well. She has no concerns at this time.   Objective: BP (!) 154/93   Pulse 84   Temp 98.9 F (37.2 C) (Oral)   Resp 18   Ht 5' (1.524 m)   Wt 100 kg   LMP 06/23/2021 (Approximate)   SpO2 99%   BMI 43.06 kg/m  No intake/output data recorded. Total I/O In: -  Out: 1640 [Urine:1640]  FHT: 130 bpm, moderate variability, +15x15 accels, no decels UC:  Q 1.5-7 minutes SVE:   Dilation: 1 Station: -3 Exam by:: Florentina Addison  Labs: Lab Results  Component Value Date   WBC 7.6 03/07/2022   HGB 9.5 (L) 03/07/2022   HCT 29.6 (L) 03/07/2022   MCV 81.3 03/07/2022   PLT 220 03/07/2022    Assessment / Plan: Karen Burke is a 36 y.o. [id number] at [redacted]w[redacted]d admitted for IOL in the setting of pre-eclampsia with severe features  Labor: Cytotec x2. Plan for foley balloon placement at next check.  Preeclampsia:  On magnesium sulfate- no signs of toxicity. Asymptomatic. Labs reassuring. I&O's appropriate. Continue to monitor.  Fetal Wellbeing:  Category I Pain Control:  prn I/D:  GBS pos > PCN Anticipated MOD:  NSVD   Brand Males, CNM 03/08/2022, 4:22 AM

## 2022-03-08 NOTE — Progress Notes (Signed)
Labor Progress Note Karen Burke is a 36 y.o. [id number] at [redacted]w[redacted]d who presented for IOL due to pre-eclampsia with severe features.   S: Doing well. Feeling more contractions, received IV Fentanyl. Partner at bedside. No concerns.   O:  BP (!) 153/88   Pulse 83   Temp 98.3 F (36.8 C) (Oral)   Resp 16   Ht 5' (1.524 m)   Wt 100 kg   LMP 06/23/2021 (Approximate)   SpO2 99%   BMI 43.06 kg/m   EFM: Baseline 120 bpm, moderate variability, + accles, no decels  Toco: Every 2-5 minutes   CVE: Dilation: 4.5 Effacement (%): Thick Cervical Position: Posterior Station: -3 Presentation: Vertex Exam by:: Dr Mathis Fare  A&P: 36 y.o. [id number] [redacted]w[redacted]d   #Labor: Progressing well s/p Cytotec and foley balloon placement. Cervix still very thick and head higher in pelvis. Will start Pitocin 2x2 and reassess for AROM on next exam in 4 hours.  #Pain: PRN; IV Fentanyl for now  #FWB: Cat 1  #GBS unknown, receiving PCN  #Pre-eclampsia with severe features: Recently given IV Labetalol for severe range blood pressure. Returned to mild range appropriately. No symptoms at this time. Recent CBC and CMP stable. Mg level 5.8. Will continue to monitor closely.   Worthy Rancher, MD 2:50 PM

## 2022-03-08 NOTE — Progress Notes (Signed)
Labor Progress Note Karen Burke is a 36 y.o. [id number] at [redacted]w[redacted]d presented for IOL due to pre-eclampsia with severe features.   S: Doing well, feeling more discomfort with contractions.   O:  BP 129/72   Pulse 83   Temp 98.3 F (36.8 C) (Oral)   Resp 18   Ht 5' (1.524 m)   Wt 100 kg   LMP 06/23/2021 (Approximate)   SpO2 100%   BMI 43.06 kg/m  EFM: 120/mod/15x15/none  CVE: Dilation: 5 Effacement (%): 60 Cervical Position: Posterior Station: -3 Presentation: Vertex Exam by:: Dr. Annia Friendly   A&P: 36 y.o. [id number] [redacted]w[redacted]d  #Labor: Some mild progression since last check-- almost ballotable still even during a contraction. Encouraged walking and using the ball with also some peanut positions to allow fetal station to descend. Plan for AROM likely on next check.  #Pain: PRN, planning to go without epidural  #FWB: Cat I  #GBS  Unknown, PCN for pre-term status.   #Pre-e with SF: Mild range BP currently without symptoms. Labs q12, 2200 CBC/CMP reassuring. Cont to monitor.   Karen Stack, DO 11:04 PM

## 2022-03-09 ENCOUNTER — Encounter (HOSPITAL_COMMUNITY): Payer: Self-pay | Admitting: Obstetrics and Gynecology

## 2022-03-09 DIAGNOSIS — O99820 Streptococcus B carrier state complicating pregnancy: Secondary | ICD-10-CM

## 2022-03-09 DIAGNOSIS — O1414 Severe pre-eclampsia complicating childbirth: Secondary | ICD-10-CM

## 2022-03-09 DIAGNOSIS — O1413 Severe pre-eclampsia, third trimester: Secondary | ICD-10-CM

## 2022-03-09 DIAGNOSIS — Z3A36 36 weeks gestation of pregnancy: Secondary | ICD-10-CM

## 2022-03-09 LAB — CBC
HCT: 28.3 % — ABNORMAL LOW (ref 36.0–46.0)
HCT: 24 % — ABNORMAL LOW (ref 36.0–46.0)
Hemoglobin: 8.8 g/dL — ABNORMAL LOW (ref 12.0–15.0)
Hemoglobin: 7.9 g/dL — ABNORMAL LOW (ref 12.0–15.0)
MCH: 25.1 pg — ABNORMAL LOW (ref 26.0–34.0)
MCH: 26.1 pg (ref 26.0–34.0)
MCHC: 31.1 g/dL (ref 30.0–36.0)
MCHC: 32.9 g/dL (ref 30.0–36.0)
MCV: 80.9 fL (ref 80.0–100.0)
MCV: 79.2 fL — ABNORMAL LOW (ref 80.0–100.0)
Platelets: 228 10*3/uL (ref 150–400)
Platelets: 218 10*3/uL (ref 150–400)
RBC: 3.5 MIL/uL — ABNORMAL LOW (ref 3.87–5.11)
RBC: 3.03 MIL/uL — ABNORMAL LOW (ref 3.87–5.11)
RDW: 18.4 % — ABNORMAL HIGH (ref 11.5–15.5)
RDW: 18.6 % — ABNORMAL HIGH (ref 11.5–15.5)
WBC: 13 10*3/uL — ABNORMAL HIGH (ref 4.0–10.5)
WBC: 9.7 10*3/uL (ref 4.0–10.5)
nRBC: 0 % (ref 0.0–0.2)
nRBC: 0 % (ref 0.0–0.2)

## 2022-03-09 LAB — COMPREHENSIVE METABOLIC PANEL
ALT: 12 U/L (ref 0–44)
AST: 19 U/L (ref 15–41)
Albumin: 2 g/dL — ABNORMAL LOW (ref 3.5–5.0)
Alkaline Phosphatase: 135 U/L — ABNORMAL HIGH (ref 38–126)
Anion gap: 9 (ref 5–15)
BUN: 5 mg/dL — ABNORMAL LOW (ref 6–20)
CO2: 23 mmol/L (ref 22–32)
Calcium: 7 mg/dL — ABNORMAL LOW (ref 8.9–10.3)
Chloride: 96 mmol/L — ABNORMAL LOW (ref 98–111)
Creatinine, Ser: 0.91 mg/dL (ref 0.44–1.00)
GFR, Estimated: >60 mL/min (ref >60)
Glucose, Bld: 100 mg/dL — ABNORMAL HIGH (ref 70–99)
Potassium: 4.1 mmol/L (ref 3.5–5.1)
Sodium: 128 mmol/L — ABNORMAL LOW (ref 135–145)
Total Bilirubin: 0.2 mg/dL — ABNORMAL LOW (ref 0.3–1.2)
Total Protein: 5.5 g/dL — ABNORMAL LOW (ref 6.5–8.1)

## 2022-03-09 LAB — MAGNESIUM
Magnesium: 6.7 mg/dL (ref 1.7–2.4)
Magnesium: 6.6 mg/dL (ref 1.7–2.4)

## 2022-03-09 MED ORDER — IBUPROFEN 600 MG PO TABS
600.0000 mg | ORAL_TABLET | Freq: Four times a day (QID) | ORAL | Status: DC
  Administered 2022-03-09 – 2022-03-11 (×8): 600 mg via ORAL
  Filled 2022-03-09 (×9): qty 1

## 2022-03-09 MED ORDER — MEASLES, MUMPS & RUBELLA VAC IJ SOLR: 0.5000 mL | Freq: Once | INTRAMUSCULAR | Status: DC

## 2022-03-09 MED ORDER — NIFEDIPINE ER OSMOTIC RELEASE 30 MG PO TB24
30.0000 mg | ORAL_TABLET | Freq: Every day | ORAL | Status: DC
  Administered 2022-03-09: 30 mg via ORAL
  Filled 2022-03-09: qty 1

## 2022-03-09 MED ORDER — MISOPROSTOL 200 MCG PO TABS
800.0000 ug | ORAL_TABLET | Freq: Once | ORAL | Status: AC
Start: 2022-03-09 — End: 2022-03-09

## 2022-03-09 MED ORDER — PRENATAL MULTIVITAMIN CH
1.0000 | ORAL_TABLET | Freq: Every day | ORAL | Status: DC
  Administered 2022-03-09 – 2022-03-11 (×3): 1 via ORAL
  Filled 2022-03-09 (×3): qty 1

## 2022-03-09 MED ORDER — ONDANSETRON HCL 4 MG/2ML IJ SOLN: 4.0000 mg | INTRAMUSCULAR | Status: DC | PRN

## 2022-03-09 MED ORDER — SIMETHICONE 80 MG PO CHEW
80.0000 mg | CHEWABLE_TABLET | ORAL | Status: DC | PRN
  Administered 2022-03-11: 80 mg via ORAL
  Filled 2022-03-09: qty 1

## 2022-03-09 MED ORDER — METHYLERGONOVINE MALEATE 0.2 MG/ML IJ SOLN
INTRAMUSCULAR | Status: AC
  Filled 2022-03-09: qty 1

## 2022-03-09 MED ORDER — MAGNESIUM SULFATE 40 GM/1000ML IV SOLN: 2.0000 g/h | INTRAVENOUS | Status: AC

## 2022-03-09 MED ORDER — DIPHENHYDRAMINE HCL 25 MG PO CAPS: 25.0000 mg | ORAL_CAPSULE | Freq: Four times a day (QID) | ORAL | Status: DC | PRN

## 2022-03-09 MED ORDER — LACTATED RINGERS IV SOLN: INTRAVENOUS | Status: DC

## 2022-03-09 MED ORDER — SODIUM CHLORIDE 0.9% FLUSH
3.0000 mL | INTRAVENOUS | Status: DC | PRN
Start: 2022-03-09 — End: 2022-03-11

## 2022-03-09 MED ORDER — FUROSEMIDE 20 MG PO TABS
20.0000 mg | ORAL_TABLET | Freq: Every day | ORAL | Status: DC
  Administered 2022-03-10 – 2022-03-11 (×2): 20 mg via ORAL
  Filled 2022-03-09 (×2): qty 1

## 2022-03-09 MED ORDER — BENZOCAINE-MENTHOL 20-0.5 % EX AERO
1.0000 "application " | INHALATION_SPRAY | CUTANEOUS | Status: DC | PRN
  Administered 2022-03-09: 1 via TOPICAL
  Filled 2022-03-09: qty 56

## 2022-03-09 MED ORDER — OXYCODONE-ACETAMINOPHEN 5-325 MG PO TABS
1.0000 | ORAL_TABLET | Freq: Once | ORAL | Status: AC
  Administered 2022-03-09: 1 via ORAL
  Filled 2022-03-09: qty 1

## 2022-03-09 MED ORDER — DIBUCAINE (PERIANAL) 1 % EX OINT: 1.0000 "application " | TOPICAL_OINTMENT | CUTANEOUS | Status: DC | PRN

## 2022-03-09 MED ORDER — SODIUM CHLORIDE 0.9% FLUSH
3.0000 mL | Freq: Two times a day (BID) | INTRAVENOUS | Status: DC
  Administered 2022-03-10 (×2): 3 mL via INTRAVENOUS

## 2022-03-09 MED ORDER — SODIUM CHLORIDE 0.9 % IV SOLN: 250.0000 mL | INTRAVENOUS | Status: DC | PRN

## 2022-03-09 MED ORDER — ONDANSETRON HCL 4 MG PO TABS: 4.0000 mg | ORAL_TABLET | ORAL | Status: DC | PRN

## 2022-03-09 MED ORDER — TETANUS-DIPHTH-ACELL PERTUSSIS 5-2.5-18.5 LF-MCG/0.5 IM SUSY: 0.5000 mL | PREFILLED_SYRINGE | Freq: Once | INTRAMUSCULAR | Status: DC

## 2022-03-09 MED ORDER — SENNOSIDES-DOCUSATE SODIUM 8.6-50 MG PO TABS
2.0000 | ORAL_TABLET | ORAL | Status: DC
  Administered 2022-03-09 – 2022-03-11 (×3): 2 via ORAL
  Filled 2022-03-09 (×3): qty 2

## 2022-03-09 MED ORDER — WITCH HAZEL-GLYCERIN EX PADS: 1.0000 "application " | MEDICATED_PAD | CUTANEOUS | Status: DC | PRN

## 2022-03-09 MED ORDER — ZOLPIDEM TARTRATE 5 MG PO TABS: 5.0000 mg | ORAL_TABLET | Freq: Every evening | ORAL | Status: DC | PRN

## 2022-03-09 MED ORDER — METHYLERGONOVINE MALEATE 0.2 MG/ML IJ SOLN
0.2000 mg | Freq: Once | INTRAMUSCULAR | Status: DC
Start: 2022-03-09 — End: 2022-03-09

## 2022-03-09 MED ORDER — SODIUM CHLORIDE 0.9 % IV SOLN: INTRAVENOUS | Status: DC

## 2022-03-09 MED ORDER — ACETAMINOPHEN 325 MG PO TABS
650.0000 mg | ORAL_TABLET | ORAL | Status: DC | PRN
  Administered 2022-03-09 – 2022-03-11 (×3): 650 mg via ORAL
  Filled 2022-03-09 (×3): qty 2

## 2022-03-09 MED ORDER — TRANEXAMIC ACID-NACL 1000-0.7 MG/100ML-% IV SOLN
INTRAVENOUS | Status: AC
  Administered 2022-03-09: 1000 mg
  Filled 2022-03-09: qty 100

## 2022-03-09 MED ORDER — MISOPROSTOL 200 MCG PO TABS
ORAL_TABLET | ORAL | Status: AC
  Administered 2022-03-09: 800 ug via RECTAL
  Filled 2022-03-09: qty 4

## 2022-03-09 MED ORDER — COCONUT OIL OIL: 1.0000 "application " | TOPICAL_OIL | Status: DC | PRN

## 2022-03-09 NOTE — Discharge Summary (Signed)
Postpartum Discharge Summary  Date of Service updated 03/11/22     Patient Name: Karen Burke DOB: 10-08-1985 MRN: [id number]  Date of admission: 03/07/2022 Delivery date:03/09/2022  Delivering provider: Patriciaann Clan  Date of discharge: 03/11/22  Admitting diagnosis: Severe preeclampsia [O14.10] Intrauterine pregnancy: [redacted]w[redacted]d    Secondary diagnosis:  Principal Problem:   Severe preeclampsia Active Problems:   Language barrier   GBS bacteriuria   Alpha thalassemia silent carrier   OSA (obstructive sleep apnea)   Postpartum hemorrhage  Additional problems: postpartum hemorrhage    Discharge diagnosis: Term Pregnancy Delivered and Preeclampsia (severe)                                              Post partum procedures: iron infusion Augmentation: AROM, Pitocin, Cytotec, and IP Foley Complications: [id number] Hospital course: Induction of Labor With Vaginal Delivery   36y.o. yo [id number] admitted to the hospital 03/07/2022 for induction of labor.  Indication for induction: Preeclampsia.  Patient had an uncomplicated labor course as follows: Membrane Rupture Time/Date: 3:19 AM ,03/09/2022   Delivery Method:Vaginal, Spontaneous  Episiotomy: None  Lacerations:  1st degree;Perineal  Details of delivery can be found in separate delivery note.  Patient had a routine postpartum course after 24 hours of magnesium sulfate recovery. Patient is discharged home 03/11/22.  Newborn Data: Birth date:03/09/2022  Birth time:5:31 AM  Gender:Female  Living status:Living  Apgars:7 ,9  Weight:2610 g   Magnesium Sulfate received: Yes: Seizure prophylaxis BMZ received: No Rhophylac:No MMR:No T-DaP:Given prenatally Flu: No Transfusion:No, pt did receive iron infusion  Physical exam  Vitals:   03/10/22 2352 03/11/22 0311 03/11/22 0835 03/11/22 1010  BP: (!) 154/87 (!) 145/84 (!) 151/93 137/85  Pulse: 98 100 (!) 106 93  Resp: _0 Temp: 98.1 F (36.7 C) 98.6 F  (37 C) 98.2 F (36.8 C)   TempSrc: Oral Oral Oral   SpO2: 97% 98% 100%   Weight:      Height:        Repeat blood pressure after scheduled medication was 137/85 General: alert, cooperative, and no distress Lochia: appropriate Uterine Fundus: firm Incision: N/A DVT Evaluation: No evidence of DVT seen on physical exam. Negative Homan's sign. Labs: Lab Results  Component Value Date   WBC 8.2 03/10/2022   HGB 7.5 (L) 03/10/2022   HCT 23.6 (L) 03/10/2022   MCV 80.3 03/10/2022   PLT 230 03/10/2022      Latest Ref Rng & Units 03/10/2022    5:19 AM  CMP  Glucose 70 - 99 mg/dL 84   BUN 6 - 20 mg/dL <5   Creatinine 0.44 - 1.00 mg/dL 0.92   Sodium 135 - 145 mmol/L 133   Potassium 3.5 - 5.1 mmol/L 4.0   Chloride 98 - 111 mmol/L 102   CO2 22 - 32 mmol/L 24   Calcium 8.9 - 10.3 mg/dL 6.8   Total Protein 6.5 - 8.1 g/dL 5.3   Total Bilirubin 0.3 - 1.2 mg/dL 0.6   Alkaline Phos 38 - 126 U/L 126   AST 15 - 41 U/L 14   ALT 0 - 44 U/L 11    Edinburgh Score:     No data to display           After visit meds:  Allergies as of  03/11/2022   No Known Allergies      Medication List     STOP taking these medications    terconazole 0.4 % vaginal cream Commonly known as: TERAZOL 7       TAKE these medications    acetaminophen 500 MG tablet Commonly known as: TYLENOL Take 500 mg by mouth every 6 (six) hours as needed for mild pain.   calcium carbonate 500 MG chewable tablet Commonly known as: TUMS - dosed in mg elemental calcium Chew 2 tablets by mouth daily as needed for indigestion or heartburn.   furosemide 20 MG tablet Commonly known as: LASIX Take 1 tablet (20 mg total) by mouth daily for 3 days. Start taking on: March 12, 2022   ibuprofen 600 MG tablet Commonly known as: ADVIL Take 1 tablet (600 mg total) by mouth every 6 (six) hours.   NIFEdipine 60 MG 24 hr tablet Commonly known as: ADALAT CC Take 1 tablet (60 mg total) by mouth 2 (two) times daily.    prenatal multivitamin Tabs tablet Take 1 tablet by mouth daily at 12 noon.         Discharge home in stable condition Infant Feeding:  breast with supplementation Infant Disposition:home with mother Discharge instruction: per After Visit Summary and Postpartum booklet. Activity: Advance as tolerated. Pelvic rest for 6 weeks.  Diet: routine diet Future Appointments: Future Appointments  Date Time Provider [place]  03/17/2022 10:00 AM [facility] NURSE [facility] None  04/15/2022  9:55 AM Anyanwu, Sallyanne Havers, MD [facility] None   Follow up Visit:  Message sent to [facility] by Dr Higinio Plan:  Please schedule this patient for a In person postpartum visit in 6 weeks with the following provider: Any provider. Additional Postpartum F/U:BP check 1 week  High risk pregnancy complicated by: HTN Delivery mode:  Vaginal, Spontaneous  Anticipated Birth Control:  Condoms   03/11/2022 Griffin Basil, MD

## 2022-03-09 NOTE — Progress Notes (Signed)
Labor Progress Note Flornce Record is a 36 y.o. [id number] at [redacted]w[redacted]d presented for IOL due to pre-eclampsia with severe features  S: Patient reports feeling more uncomfortable with contractions.    O:  BP (!) 147/83   Pulse 82   Temp 98.1 F (36.7 C) (Oral)   Resp 16   Ht 5' (1.524 m)   Wt 100 kg   LMP 06/23/2021 (Approximate)   SpO2 98%   BMI 43.06 kg/m  EFM: 130/mod/15x15/none  CVE: Dilation: 5 Effacement (%): 60 Cervical Position: Posterior Station: -3 Presentation: Vertex Exam by:: Dr. Annia Friendly.   A&P: 36 y.o. [id number] [redacted]w[redacted]d  #Labor: Progressing. Contractions have been spacing out to q3-73min. Pitocin increased to 20 milli-units/min. Cervix unchanged from last check but could feel fetal head. AROM'd at 0320 with clear fluid. Will reassess in a few hours.  #Pain: PRN, planning no epidural #FWB: Cat 1 #GBS  unknown, PCN for pre-term status #Pre-E with SF: BP in 140s/80s, no symptoms. Will continue to monitor.   Charolett Bumpers, Medical Student 3:37 AM

## 2022-03-09 NOTE — Lactation Note (Signed)
This note was copied from a baby's chart. Lactation Consultation Note  Patient Name: Karen Burke Date: 03/09/2022   Age:34 hours  Mom very tired as per RN, Barbaraann Rondo. RN to call when Mom is ready to breastfeed and set up pump. Mom PIH (MgSulfate) PPH. Mom's request to set up and start using DEBP.   Maternal Data    Feeding Nipple Type: Slow - flow  LATCH Score                    Lactation Tools Discussed/Used    Interventions    Discharge    Consult Status      Karen Burke  Karen Burke 03/09/2022, 11:10 AM

## 2022-03-09 NOTE — Lactation Note (Addendum)
This note was copied from a baby's chart. Lactation Consultation Note  Patient Name: Karen Burke [id number] Date: 03/09/2022 Reason for consult: Initial assessment;Mother's request;Difficult latch;Early term 37-38.6wks;Infant < 6lbs;Breastfeeding assistance;Other (Comment) (PIH ( Nifedipine and Procardia, MgSulfate) PPH) Age:34 hours  Mom declined working on breastfeeding or pumping for now. She is feeling tired due to medication.  Dad bottle feeding formula, LC went over supplementation volume and pace bottle feeding.  LC reviewed LPTI guidelines including keeping total feeding under 30 min.  Infant recently bottle fed 15 ml prior to LC arrival.  LC set up DEBP and reviewed parts but unable to size assessment at this time.  Mom to call for latch assistance with next feeding.  All questions answered at the end of the visit.    LC returned to assist with latching, observed 6 min latch. Infant only able to latch on right, not able to get enough depth on left.  LC alerted RN to encourage Mom to pump after breastfeeding.  Parents aware to offer EBM first followed by formula with pace bottle feeding and slow flow nipple.  Mom to post pump after latching for 15 min.  All questions answered at the end of the visit.   Maternal Data    Feeding Mother's Current Feeding Choice: Breast Milk and Formula Nipple Type: Extra Slow Flow  LATCH Score                    Lactation Tools Discussed/Used Tools: Pump;Flanges Flange Size: 27 Breast pump type: Double-Electric Breast Pump Pump Education: Setup, frequency, and cleaning;Milk Storage Reason for Pumping: increase stimulation Pumping frequency: post pump after latching for 15 min  Interventions Interventions: Breast feeding basics reviewed;Expressed milk;DEBP;Education;Pace feeding;Infant Driven Feeding Algorithm education;LPT handout/interventions;LC Services brochure  Discharge Pump: Stork Pump [employer] Program: No  Consult  Status Consult Status: Follow-up Date: 03/10/22 Follow-up type: In-patient    [name]  [name] 03/09/2022, 2:49 PM

## 2022-03-09 NOTE — Lactation Note (Signed)
This note was copied from a baby's chart. Lactation Consultation Note  Patient Name: Karen Burke [id number] Date: 03/09/2022   Age:34 hours  LC checked in with RN, Barbaraann Rondo to see if Mom ready for a visit. RN states Mom still sleeping, infant taking formula at this time.    Maternal Data    Feeding Nipple Type: Slow - flow  LATCH Score                    Lactation Tools Discussed/Used    Interventions    Discharge    Consult Status      Zackaria Burkey  [name] 03/09/2022, 1:08 PM

## 2022-03-10 ENCOUNTER — Encounter: Payer: Managed Care, Other (non HMO) | Admitting: Family Medicine

## 2022-03-10 LAB — CBC
HCT: 23.6 % — ABNORMAL LOW (ref 36.0–46.0)
Hemoglobin: 7.5 g/dL — ABNORMAL LOW (ref 12.0–15.0)
MCH: 25.5 pg — ABNORMAL LOW (ref 26.0–34.0)
MCHC: 31.8 g/dL (ref 30.0–36.0)
MCV: 80.3 fL (ref 80.0–100.0)
Platelets: 230 10*3/uL (ref 150–400)
RBC: 2.94 MIL/uL — ABNORMAL LOW (ref 3.87–5.11)
RDW: 18.6 % — ABNORMAL HIGH (ref 11.5–15.5)
WBC: 8.2 10*3/uL (ref 4.0–10.5)
nRBC: 0 % (ref 0.0–0.2)

## 2022-03-10 LAB — COMPREHENSIVE METABOLIC PANEL
ALT: 11 U/L (ref 0–44)
AST: 14 U/L — ABNORMAL LOW (ref 15–41)
Albumin: 2 g/dL — ABNORMAL LOW (ref 3.5–5.0)
Alkaline Phosphatase: 126 U/L (ref 38–126)
Anion gap: 7 (ref 5–15)
BUN: <5 mg/dL — ABNORMAL LOW (ref 6–20)
CO2: 24 mmol/L (ref 22–32)
Calcium: 6.8 mg/dL — ABNORMAL LOW (ref 8.9–10.3)
Chloride: 102 mmol/L (ref 98–111)
Creatinine, Ser: 0.92 mg/dL (ref 0.44–1.00)
GFR, Estimated: >60 mL/min (ref >60)
Glucose, Bld: 84 mg/dL (ref 70–99)
Potassium: 4 mmol/L (ref 3.5–5.1)
Sodium: 133 mmol/L — ABNORMAL LOW (ref 135–145)
Total Bilirubin: 0.6 mg/dL (ref 0.3–1.2)
Total Protein: 5.3 g/dL — ABNORMAL LOW (ref 6.5–8.1)

## 2022-03-10 MED ORDER — SODIUM CHLORIDE 0.9 % IV SOLN
500.0000 mg | Freq: Once | INTRAVENOUS | Status: AC
  Administered 2022-03-10: 500 mg via INTRAVENOUS
  Filled 2022-03-10 (×2): qty 25

## 2022-03-10 MED ORDER — NIFEDIPINE ER OSMOTIC RELEASE 60 MG PO TB24
60.0000 mg | ORAL_TABLET | Freq: Every day | ORAL | Status: DC
  Administered 2022-03-10 – 2022-03-11 (×2): 60 mg via ORAL
  Filled 2022-03-10 (×2): qty 1

## 2022-03-10 MED ORDER — DIPHENHYDRAMINE HCL 25 MG PO CAPS
25.0000 mg | ORAL_CAPSULE | Freq: Once | ORAL | Status: AC
Start: 2022-03-10 — End: 2022-03-10
  Administered 2022-03-10: 25 mg via ORAL
  Filled 2022-03-10: qty 1

## 2022-03-10 NOTE — Lactation Note (Addendum)
This note was copied from a baby's chart. Lactation Consultation Note  Patient Name: Karen Burke [id number] Date: 03/10/2022 Reason for consult: Follow-up assessment;Mother's request;Difficult latch;Early term 37-38.6wks;Breastfeeding assistance Age:34 hours  Infant fed 18 ml at 11 am. LC not able to see a latch at this time.  Mom off MgSulfate, LC encouraged Mom to post pump after latching. LC assessed flange size and got Mom pumping with colostrum noted during visit.  Parents aware to keep total feeding under 30 min.  Mom to call for latch assistance with next feeding.  Stork pump delivered to the room.   Plan 1. To feed based on cues 8-12x 24hr period. Mom to offer breasts and look for signs of milk transfer.  2. Mom to supplement with EBM first followed by formula with pace bottle feeding with slow flow nipple.  3. Post pumping after latching for 15 min.   Maternal Data Has patient been taught Hand Expression?: Yes  Feeding Mother's Current Feeding Choice: Breast Milk and Formula Nipple Type: Extra Slow Flow  LATCH Score                    Lactation Tools Discussed/Used Tools: Pump;Flanges Flange Size: 21;24 Breast pump type: Double-Electric Breast Pump Pump Education: Setup, frequency, and cleaning;Milk Storage Reason for Pumping: increase stimulation Pumping frequency: after feeding for 15 min  Interventions Interventions: Breast feeding basics reviewed;Skin to skin;Hand express;Expressed milk;DEBP;Education;Pace feeding;LC Services brochure;LPT handout/interventions;Infant Driven Feeding Algorithm education  Discharge Pump: Stork Pump;DEBP  Consult Status Consult Status: Follow-up Date: 03/11/22 Follow-up type: In-patient    Victoriano Campion  [name] 03/10/2022, 12:15 PM

## 2022-03-10 NOTE — Plan of Care (Signed)
  Problem: Education: Goal: Knowledge of the prescribed therapeutic regimen will improve Outcome: Completed/Met   Problem: Education: Goal: Knowledge of condition will improve Outcome: Completed/Met   Problem: Activity: Goal: Will verbalize the importance of balancing activity with adequate rest periods Outcome: Completed/Met Goal: Ability to tolerate increased activity will improve Outcome: Completed/Met   Problem: Coping: Goal: Ability to identify and utilize available resources and services will improve Outcome: Completed/Met   Problem: Role Relationship: Goal: Ability to demonstrate positive interaction with newborn will improve Outcome: Completed/Met   Problem: Education: Goal: Knowledge of General Education information will improve Description: Including pain rating scale, medication(s)/side effects and non-pharmacologic comfort measures Outcome: Completed/Met   Problem: Activity: Goal: Risk for activity intolerance will decrease Outcome: Completed/Met   Problem: Nutrition: Goal: Adequate nutrition will be maintained Outcome: Completed/Met   Problem: Coping: Goal: Level of anxiety will decrease Outcome: Completed/Met   Problem: Elimination: Goal: Will not experience complications related to bowel motility Outcome: Completed/Met Goal: Will not experience complications related to urinary retention Outcome: Completed/Met   Problem: Pain Managment: Goal: General experience of comfort will improve Outcome: Completed/Met

## 2022-03-10 NOTE — Progress Notes (Signed)
POSTPARTUM PROGRESS NOTE  Post Partum Day 1  Subjective:  Karen Burke is a 36 y.o. [id number] s/p SVD at [redacted]w[redacted]d.  She reports she is doing well but feeling a little weak. No acute events overnight. She denies any problems with ambulating, voiding or po intake. Denies nausea or vomiting.  Pain is well controlled.  Lochia is WNL.  Objective: Blood pressure (!) 147/87, pulse 97, temperature 98.2 F (36.8 C), temperature source Oral, resp. rate 18, height 5' (1.524 m), weight 100 kg, last menstrual period 06/23/2021, SpO2 97 %, unknown if currently breastfeeding.  Physical Exam:  General: alert, cooperative and no distress Chest: no respiratory distress Heart:regular rate, distal pulses intact Abdomen: soft, nontender,  Uterine Fundus: firm, appropriately tender DVT Evaluation: No calf swelling or tenderness Extremities: 2+ bilateral LE edema Skin: warm, dry  Recent Labs    03/09/22 1916 03/10/22 0519  HGB 7.9* 7.5*  HCT 24.0* 23.6*    Assessment/Plan: Karen Burke is a 36 y.o. [id number] s/p SVD at [redacted]w[redacted]d   PPD#1 - Doing well  Routine postpartum care Anemia noted c/w maternal fatigue sx, will give iron infusion today, pt and spouse are aware. Contraception: condoms Feeding: breast/bottle Dispo: Plan for discharge on 03/11/22 if BP is better controlled.   LOS: 3 days   Mariel Aloe, MD Faculty attending 03/10/2022, 9:45 AM

## 2022-03-11 ENCOUNTER — Other Ambulatory Visit: Payer: Managed Care, Other (non HMO)

## 2022-03-11 ENCOUNTER — Ambulatory Visit: Payer: Managed Care, Other (non HMO)

## 2022-03-11 ENCOUNTER — Other Ambulatory Visit (HOSPITAL_COMMUNITY): Payer: Self-pay

## 2022-03-11 MED ORDER — FUROSEMIDE 20 MG PO TABS
20.0000 mg | ORAL_TABLET | Freq: Every day | ORAL | 0 refills | Status: AC
Start: 2022-03-12 — End: 2022-03-15
  Filled 2022-03-11: qty 3, 3d supply, fill #0

## 2022-03-11 MED ORDER — IBUPROFEN 600 MG PO TABS
600.0000 mg | ORAL_TABLET | Freq: Four times a day (QID) | ORAL | 1 refills | Status: AC
  Filled 2022-03-11: qty 30, 8d supply, fill #0

## 2022-03-11 MED ORDER — NIFEDIPINE ER 60 MG PO TB24
60.0000 mg | ORAL_TABLET | Freq: Two times a day (BID) | ORAL | 0 refills | Status: DC
  Filled 2022-03-11: qty 60, 30d supply, fill #0

## 2022-03-11 NOTE — Lactation Note (Signed)
This note was copied from a baby's chart. Lactation Consultation Note  Patient Name: Karen Burke [id number] Date: 03/11/2022 Reason for consult: Follow-up assessment;Early term 37-38.6wks;Infant < 6lbs;Infant weight loss;Breastfeeding assistance (0.92 % WL) Age:34 hours  P3, Early term, Infant female, 0.92% WL  LC entered the room and dad was holding the baby. The parents state that they have been doing some breast feeding and feeding baby infant formula. LC spoke with the parents about engorgement, breast care, warning signs, and infant I/O.   Mom states that she feels as if her milk supply will increase when she gets home and is able to take a shower. LC encouraged mom and informed her that milk production increases with stimulation. [place] spoke with mom about sticking to her pumping schedule and trying not to go more than 4 hrs at night without pumping. Per dad, he believes that she became engorged with their last child due to not emptying the breast as frequently at night.   Dad asked about the milk storage guidelines. LC reviewed milk storage guidelines with the parents. Mom also wanted to try latching baby to get assistance. We attempted to latch baby in the cross-cradle position with the tea cup hold, but baby was sleepy. Dad states that she just ate and was probably full.   Dad asked how much they should give baby when supplementing today. LC reviewed the late pre-term supplementation guidelines with the family.   [place] reminded the parents about our outpatient services.  The parents state that they have no further questions.   Current Feeding Plan:  Breastfeed 8+ times per day according to feeding cues.  Latch baby prior to giving formula.  Feed baby formula after breastfeeding according to late pre-term supplementation guidelines.  Each feeding will be no more than 30 min including breastfeeding and supplementation.  Mom will pump for 15 min.  Mom will call the pediatrician or her  provider with concerns.  Mom will contact outpatient LC or call the help line if she needs further assistance with breastfeeding.   Feeding Nipple Type: Nfant Slow Flow (purple)   Interventions Interventions: Breast feeding basics reviewed;Assisted with latch;Adjust position;Education  Discharge Discharge Education: Engorgement and breast care;Warning signs for feeding baby;Outpatient recommendation  Consult Status Consult Status: Complete Date: 03/11/22 Follow-up type: Call as needed    Lysbeth Penner 03/11/2022, 12:03 PM

## 2022-03-17 ENCOUNTER — Ambulatory Visit (INDEPENDENT_AMBULATORY_CARE_PROVIDER_SITE_OTHER): Payer: Managed Care, Other (non HMO)

## 2022-03-17 ENCOUNTER — Encounter: Payer: Managed Care, Other (non HMO) | Admitting: Family Medicine

## 2022-03-17 VITALS — BP 139/94 | HR 118 | Wt 202.0 lb

## 2022-03-17 DIAGNOSIS — Z0130 Encounter for examination of blood pressure without abnormal findings: Secondary | ICD-10-CM

## 2022-03-17 MED ORDER — NIFEDIPINE ER 60 MG PO TB24
60.0000 mg | ORAL_TABLET | Freq: Two times a day (BID) | ORAL | 1 refills | Status: DC
Start: 2022-03-17 — End: 2022-04-15

## 2022-03-17 MED ORDER — HYDROCHLOROTHIAZIDE 25 MG PO TABS: 25.0000 mg | ORAL_TABLET | Freq: Every day | ORAL | 3 refills | Status: AC

## 2022-03-17 NOTE — Progress Notes (Signed)
Subjective:  Karen Burke is a 36 y.o. female here for BP check.   Hypertension ROS: taking medications as instructed, no medication side effects noted, no TIA's, no chest pain on exertion, no dyspnea on exertion, and noting swelling of ankles.    Objective:  BP (!) 139/94   Pulse (!) 118   Wt 202 lb (91.6 kg)   LMP 06/23/2021 (Approximate)   BMI 39.45 kg/m   Appearance alert, well appearing, and in no distress. General exam BP noted to be well controlled today in office.    Assessment:   Blood Pressure needs improvement.   Plan:  HCTZ added .

## 2022-03-24 ENCOUNTER — Encounter: Payer: Managed Care, Other (non HMO) | Admitting: Family Medicine

## 2022-03-31 ENCOUNTER — Encounter: Payer: Managed Care, Other (non HMO) | Admitting: Family Medicine

## 2022-04-15 ENCOUNTER — Ambulatory Visit (INDEPENDENT_AMBULATORY_CARE_PROVIDER_SITE_OTHER): Payer: Managed Care, Other (non HMO) | Admitting: Family Medicine

## 2022-04-15 ENCOUNTER — Encounter: Payer: Self-pay | Admitting: Family Medicine

## 2022-04-15 ENCOUNTER — Ambulatory Visit: Payer: Managed Care, Other (non HMO) | Admitting: Family Medicine

## 2022-04-15 DIAGNOSIS — E669 Obesity, unspecified: Secondary | ICD-10-CM

## 2022-04-15 DIAGNOSIS — Z789 Other specified health status: Secondary | ICD-10-CM

## 2022-04-15 DIAGNOSIS — I10 Essential (primary) hypertension: Secondary | ICD-10-CM

## 2022-04-15 MED ORDER — NIFEDIPINE ER 60 MG PO TB24
60.0000 mg | ORAL_TABLET | Freq: Two times a day (BID) | ORAL | 0 refills | Status: AC
Start: 2022-04-15 — End: ?

## 2022-04-15 MED ORDER — NORETHINDRONE 0.35 MG PO TABS: 1.0000 | ORAL_TABLET | Freq: Every day | ORAL | 3 refills | Status: AC

## 2022-04-15 NOTE — Progress Notes (Signed)
Post Partum Visit Note  Karen Burke is a 36 y.o. [phone] female who presents for a postpartum visit. She is 5 weeks postpartum following a normal spontaneous vaginal delivery.  I have fully reviewed the prenatal and intrapartum course. The delivery was at 37 gestational weeks.  Anesthesia: none. Postpartum course has been . Baby is doing well. Baby is feeding by  Breast and Bottle . Bleeding no bleeding. Bowel function is normal. Bladder function is normal. Patient is not sexually active. Contraception method is OCP (estrogen/progesterone). Postpartum depression screening: negative.   The pregnancy intention screening data noted above was reviewed. Potential methods of contraception were discussed. The patient elected to proceed with No data recorded.   Edinburgh Postnatal Depression Scale - 04/15/22 1444       Edinburgh Postnatal Depression Scale:  In the Past 7 Days   I have been able to laugh and see the funny side of things. 0    I have looked forward with enjoyment to things. 0    I have blamed myself unnecessarily when things went wrong. 0    I have been anxious or worried for no good reason. 0    I have felt scared or panicky for no good reason. 0    Things have been getting on top of me. 0    I have been so unhappy that I have had difficulty sleeping. 0    I have felt sad or miserable. 0    I have been so unhappy that I have been crying. 0    The thought of harming myself has occurred to me. 0    Edinburgh Postnatal Depression Scale Total 0             Health Maintenance Due  Topic Date Due   COVID-19 Vaccine (1) Never done    The following portions of the patient's history were reviewed and updated as appropriate: allergies, current medications, past family history, past medical history, past social history, past surgical history, and problem list.  Review of Systems Pertinent items are noted in HPI.  Objective:  BP (!) 149/101   Pulse 88   Wt 198 lb (89.8  kg)   LMP 06/23/2021 (Approximate)   Breastfeeding Yes   BMI 38.67 kg/m    General:  alert, cooperative, and no distress   Breasts:  not indicated  Lungs: clear to auscultation bilaterally  Heart:  regular rate and rhythm, S1, S2 normal, no murmur, click, rub or gallop  Abdomen: soft, non-tender; bowel sounds normal; no masses,  no organomegaly   Wound N/a  GU exam:  not indicated       Assessment:     1. Postpartum care and examination   2. Hypertension, unspecified type   3. Language barrier   4. Obesity (BMI 30-39.9)      Plan:   Essential components of care per ACOG recommendations:  1.  Mood and well being: Patient with negative depression screening today. Reviewed local resources for support.  - Patient tobacco use? No.   - hx of drug use? No.    2. Infant care and feeding:  -Patient currently breastmilk feeding? Yes. Reviewed importance of draining breast regularly to support lactation.  -Social determinants of health (SDOH) reviewed in EPIC. No concerns  3. Sexuality, contraception and birth spacing - Patient does not want a pregnancy in the next year.   - Reviewed reproductive life planning. Reviewed contraceptive methods based on pt preferences and effectiveness.  Patient  desired Oral Contraceptive today.   - Discussed birth spacing of 18 months  4. Sleep and fatigue -Encouraged family/partner/community support of 4 hrs of uninterrupted sleep to help with mood and fatigue  5. Physical Recovery  - Discussed patients delivery and complications. She describes her labor as good. - Patient had a Vaginal, no problems at delivery. Patient had a 1st degree laceration. Perineal healing reviewed. Patient expressed understanding - Patient has urinary incontinence? No. - Patient is safe to resume physical and sexual activity  6.  Health Maintenance - HM due items addressed Yes - Last pap smear  Diagnosis  Date Value Ref Range Status  12/24/2019   Final   -  Negative for intraepithelial lesion or malignancy (NILM)   Pap smear not done at today's visit.  -Breast Cancer screening indicated? No.   7. Chronic Disease/Pregnancy Condition follow up: Hypertension  - PCP follow up  Levie Heritage, DO [facility], [facility]

## 2024-03-06 IMAGING — US US MFM FETAL BPP W/ NON-STRESS
1 series · 13 of 28 positions shown · non-contrast
Comparison: none

[Series 1: us mfm fetal bpp w/ non-stress · 39 acquisitions, 13 frames shown]
[im 2/39]
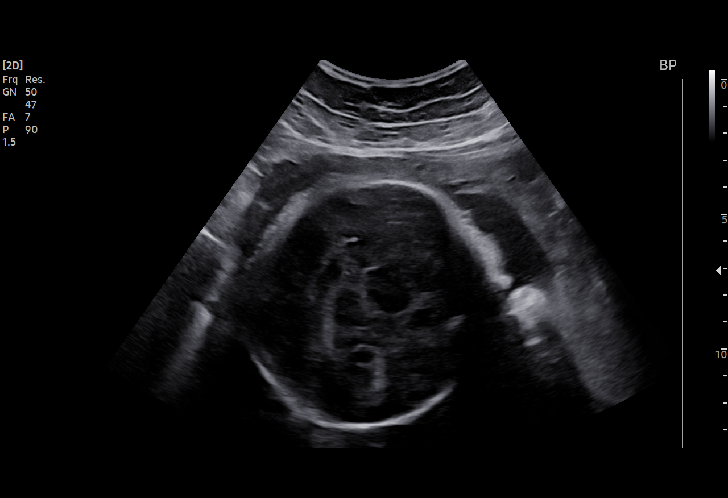
[im 5/39]
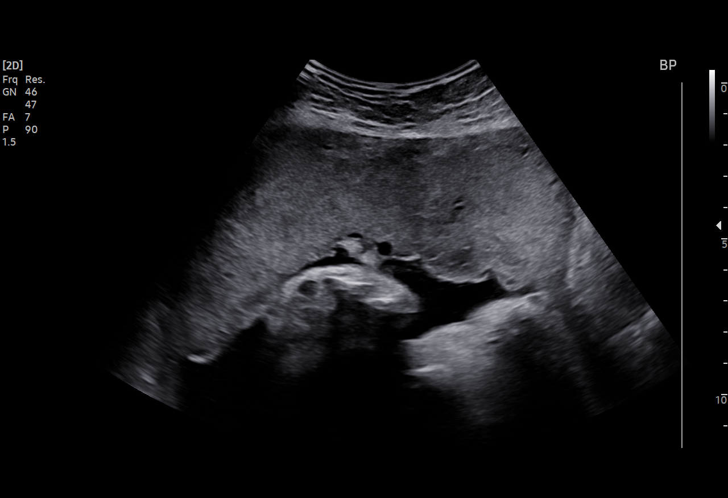
[im 8/39]
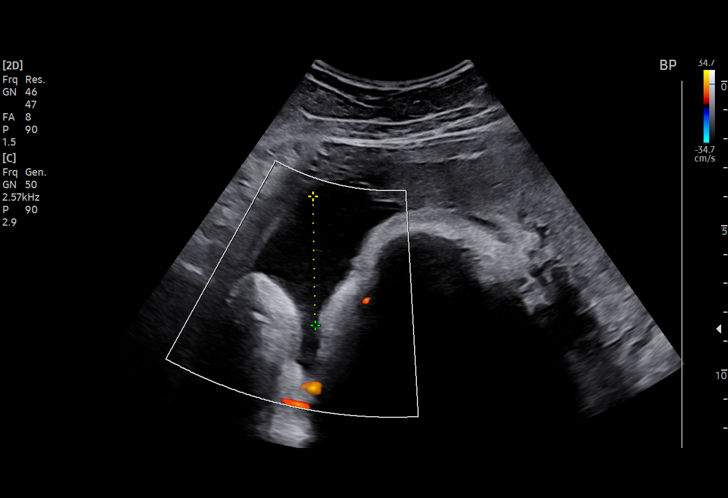
[im 10/39]
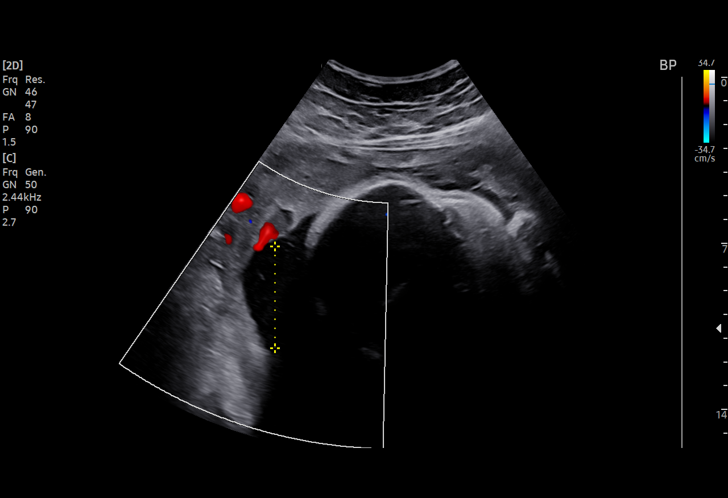
[im 13/39]
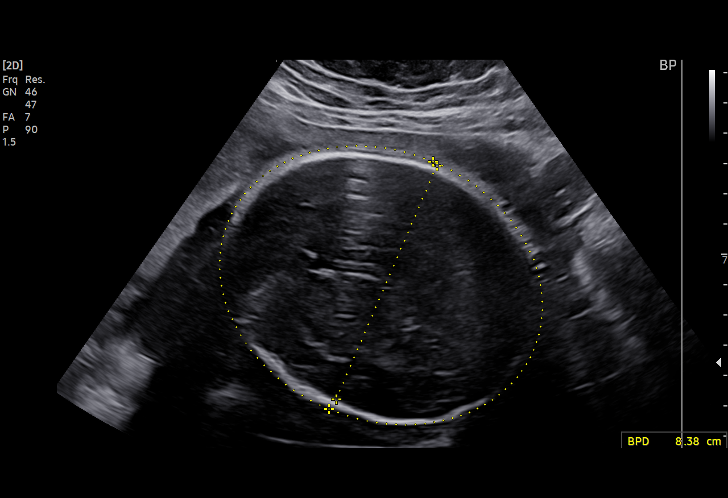
[im 16/39]
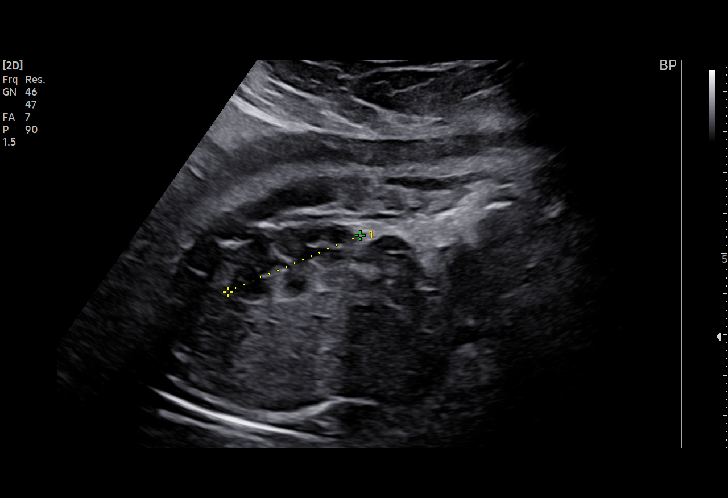
[im 20/39]
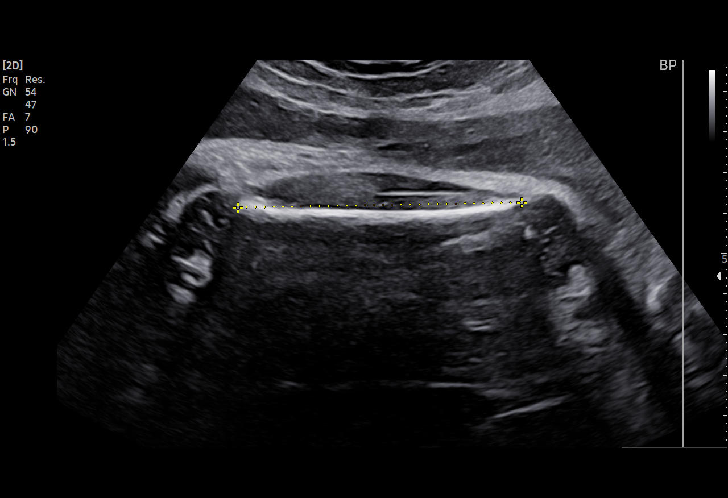
[im 23/39]
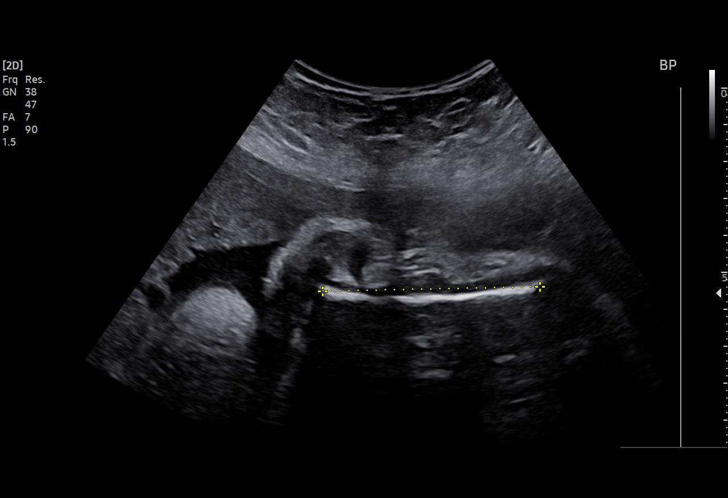
[im 26/39]
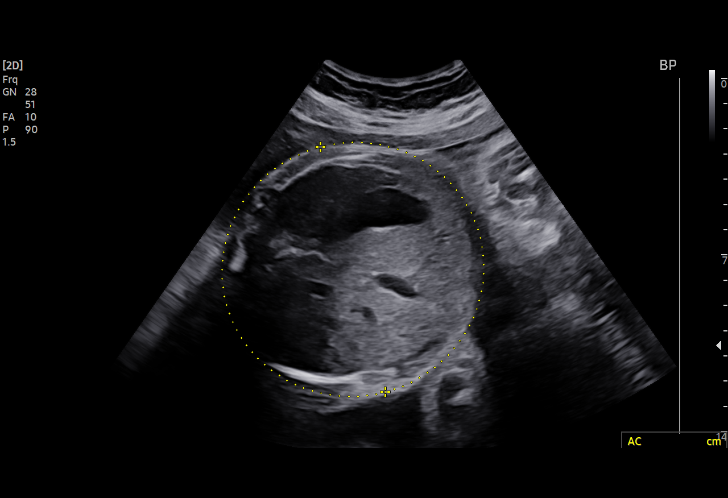
[im 29/39]
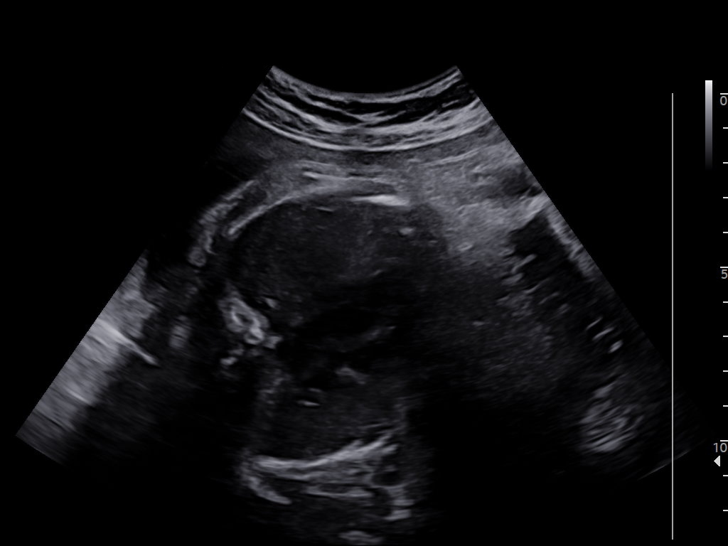
[im 31/39]
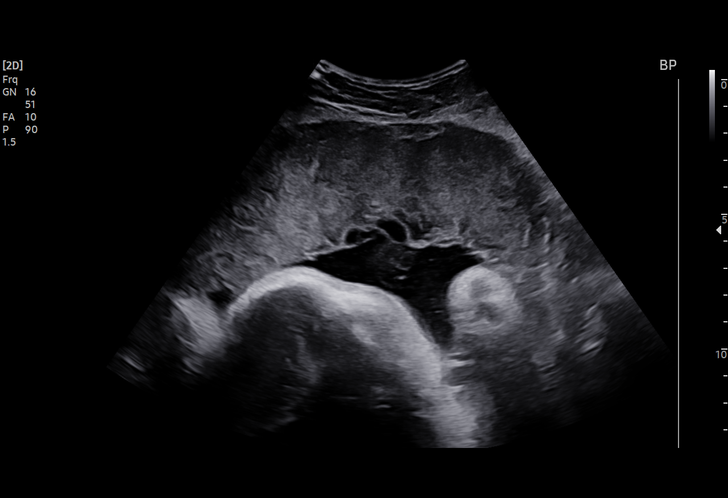
[im 34/39]
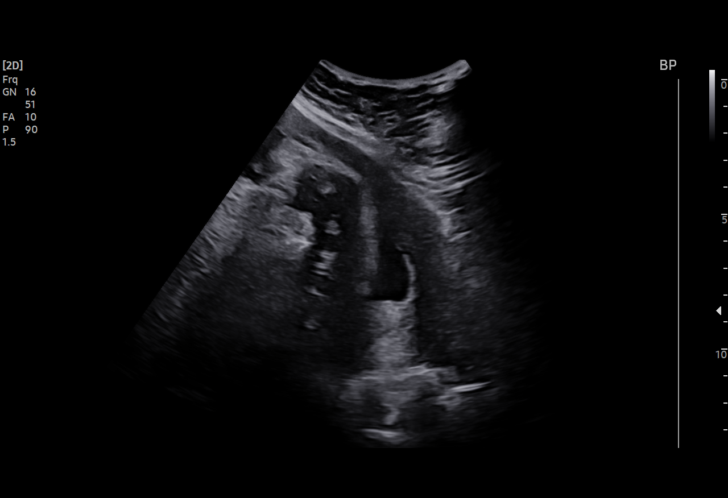
[im 37/39]
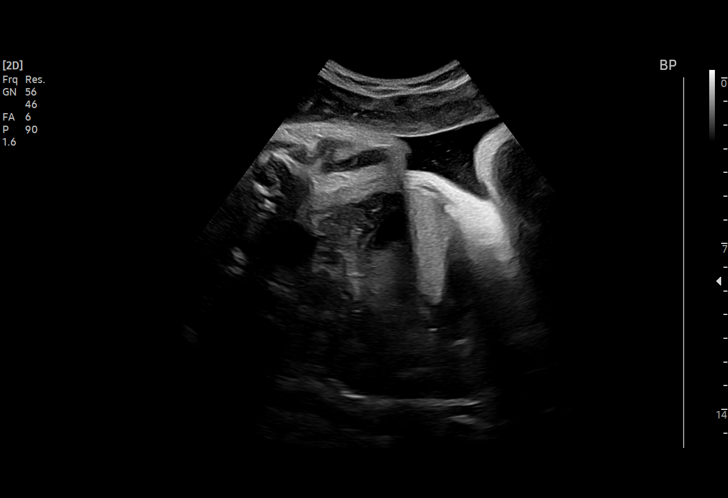

[13 of 28 positions shown; findings below may reference images not displayed]

CNM

    W/NONSTRESS

Indications

 Obesity complicating pregnancy, third
 trimester
 Genetic carrier (silent Lashbergis Chexeria
 (aa/a-)
 LR NIPS/AFP-Neg
 35 weeks gestation of pregnancy
Fetal Evaluation

 Num Of Fetuses:         1
 Fetal Heart Rate(bpm):  138
 Cardiac Activity:       Observed
 Presentation:           Transverse, head to maternal left
 Placenta:               Anterior
 P. Cord Insertion:      Previously Visualized

 Amniotic Fluid
 AFI FV:      Within normal limits

 AFI Sum(cm)     %Tile       Largest Pocket(cm)
 13.22           45

 RUQ(cm)       RLQ(cm)       LUQ(cm)        LLQ(cm)

Biophysical Evaluation
 Amniotic F.V:   Pocket => 2 cm             F. Tone:        Observed
 F. Movement:    Observed                   N.S.T:          Reactive
 F. Breathing:   Not Observed               Score:          [DATE]
Biometry

 BPD:      84.2  mm     G. Age:  33w 6d         17  %    CI:        73.15   %    70 - 86
                                                         FL/HC:      22.0   %    20.1 -
 HC:      312.9  mm     G. Age:  35w 0d         14  %    HC/AC:      0.97        0.93 -
 AC:      321.7  mm     G. Age:  36w 1d         79  %    FL/BPD:     81.6   %    71 - 87
 FL:       68.7  mm     G. Age:  35w 2d         43  %    FL/AC:      21.4   %    20 - 24
 HUM:      58.2  mm     G. Age:  33w 5d         36  %

 Est. FW:    4900  gm    5 lb 15 oz      55  %
OB History

 Gravidity:    6         Term:   2        Prem:   0        SAB:   3
 TOP:          0       Ectopic:  0        Living: 2
Gestational Age

 LMP:           35w 2d        Date:  06/23/21                 EDD:   03/30/22
 U/S Today:     35w 1d                                        EDD:   03/31/22
 Best:          35w 2d     Det. By:  LMP  (06/23/21)          EDD:   03/30/22
Anatomy

 Cranium:               Appears normal         Aortic Arch:            Previously seen
 Cavum:                 Previously seen        Ductal Arch:            Previously seen
 Ventricles:            Previously seen        Diaphragm:              Appears normal
 Choroid Plexus:        Previously seen        Stomach:                Appears normal, left
                                                                       sided
 Cerebellum:            Previously seen        Abdomen:                Previously seen
 Posterior Fossa:       Previously seen        Abdominal Wall:         Previously seen
 Nuchal Fold:           Not applicable (>20    Cord Vessels:           Previously seen
                        wks GA)
 Face:                  Orbits appear          Kidneys:                Appear normal
                        normal
 Lips:                  Previously seen        Bladder:                Appears normal
 Thoracic:              Appears normal         Spine:                  Limited views
                                                                       previously seen
 Heart:                 Previously seen        Upper Extremities:      Previously seen
 RVOT:                  Previously seen        Lower Extremities:      Previously seen
 LVOT:                  Previously seen

 Other:  Nasal bone, lenses, maxilla, falx, heels/feet, open hands/5th digits,
         VC, 3VV and 3VTV visualized prev. Female gender previously seen.
         Technically difficult due to fetal pos.
Cervix Uterus Adnexa

 Cervix
 Not visualized (advanced GA >48wks)
 Uterus
 Normal shape and size.

 Right Ovary
 Not visualized.

 Left Ovary
 Not visualized.
Comments

 This patient was seen for a BPP and growth scan due to
 maternal obesity with a BMI of 40.  She denies any problems
 since her last exam.
 The fetal growth and amniotic fluid level were appropriate for
 her gestational age.
 A biophysical profile performed today was [DATE].  She
 received a -2 for fetal breathing movements that did not meet
 criteria.  She subsequently had a reactive NST making her
 total biophysical profile score [DATE].
 She will return in 1 week for another BPP.

## 2024-03-14 IMAGING — US US MFM FETAL BPP W/O NON-STRESS
1 series · 14 of 28 positions shown · non-contrast
Comparison: none

[Series 1: us mfm fetal bpp w/o non-stress · 29 acquisitions, 14 frames shown]
[im 2/29]
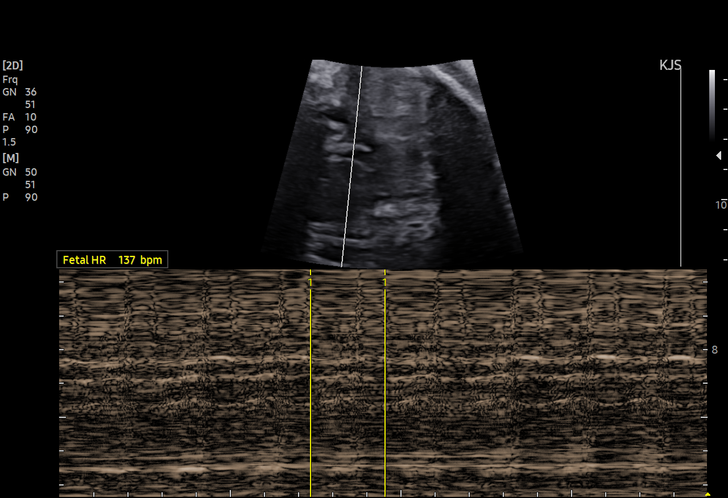
[im 4/29]
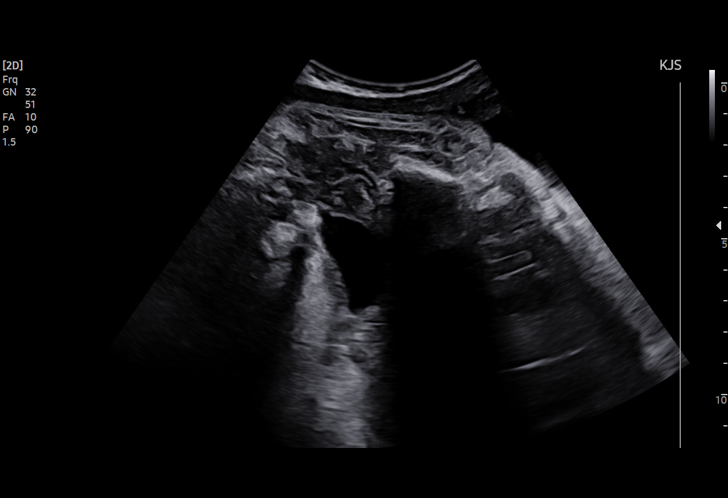
[im 6/29]
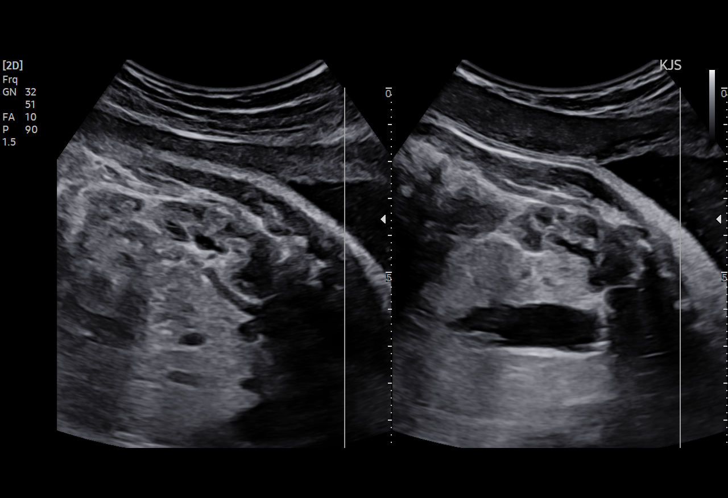
[im 8/29]
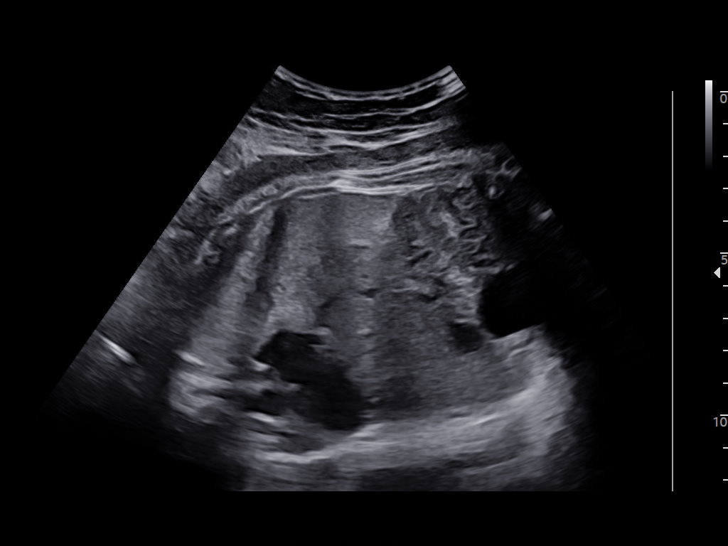
[im 10/29]
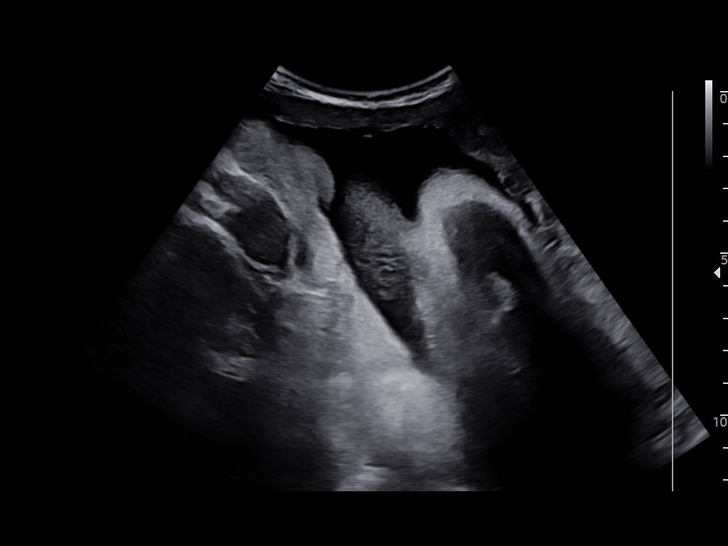
[im 12/29]
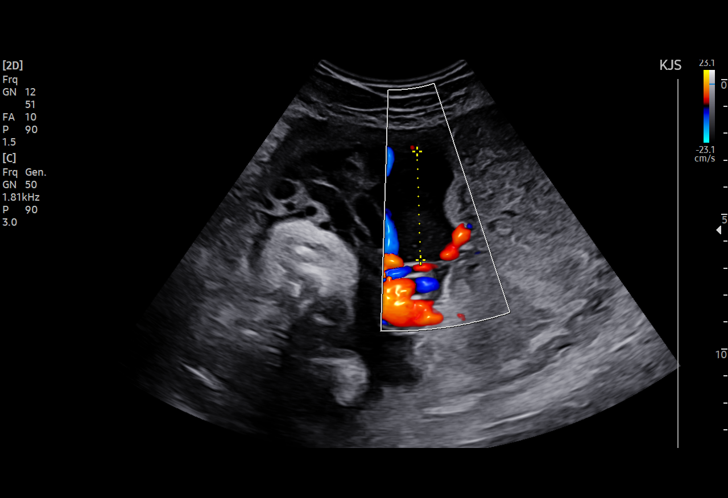
[im 14/29]
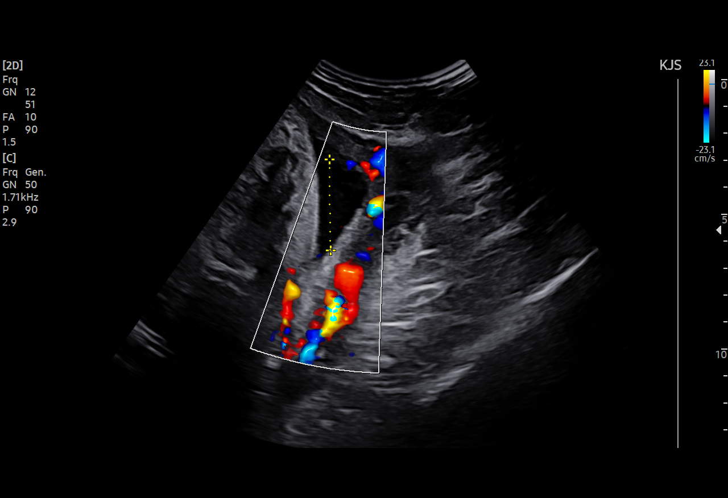
[im 16/29]
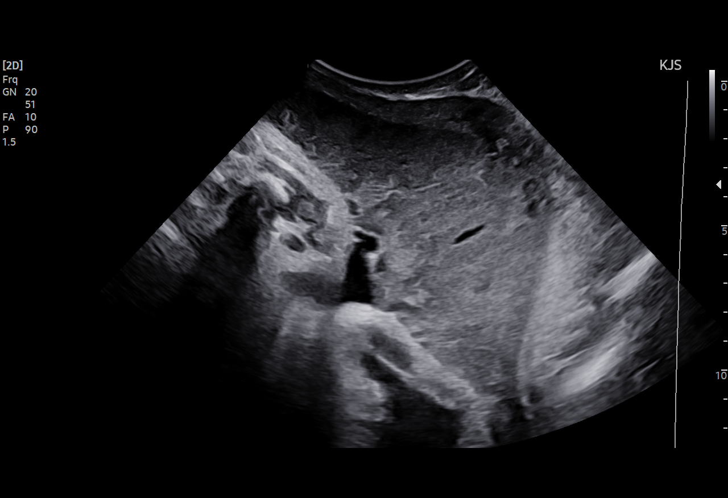
[im 18/29]
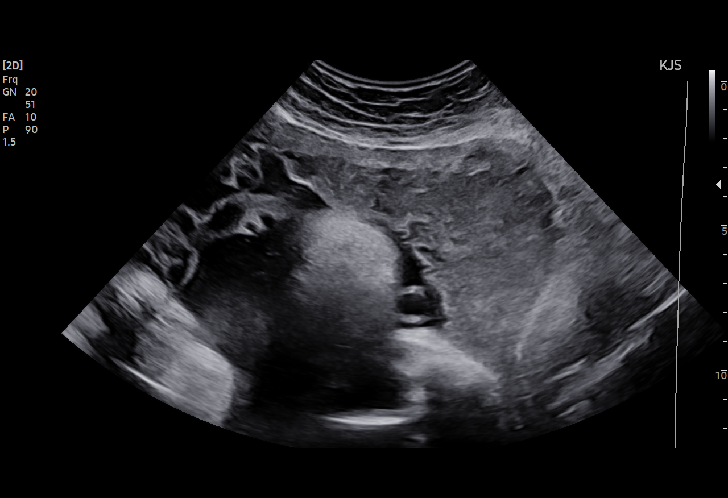
[im 20/29]
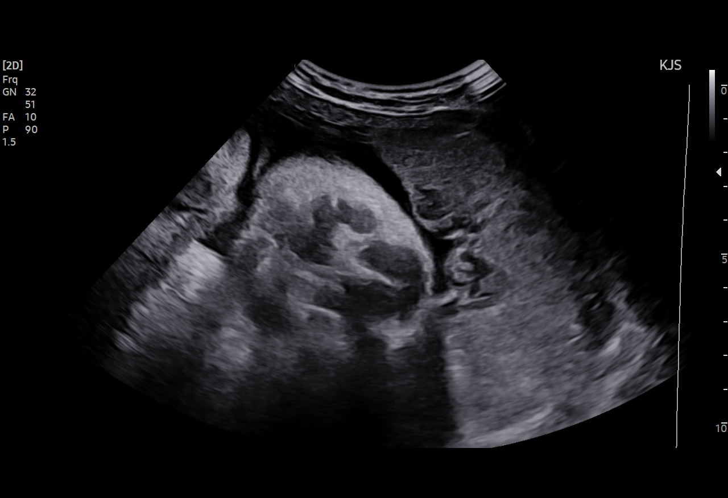
[im 22/29]
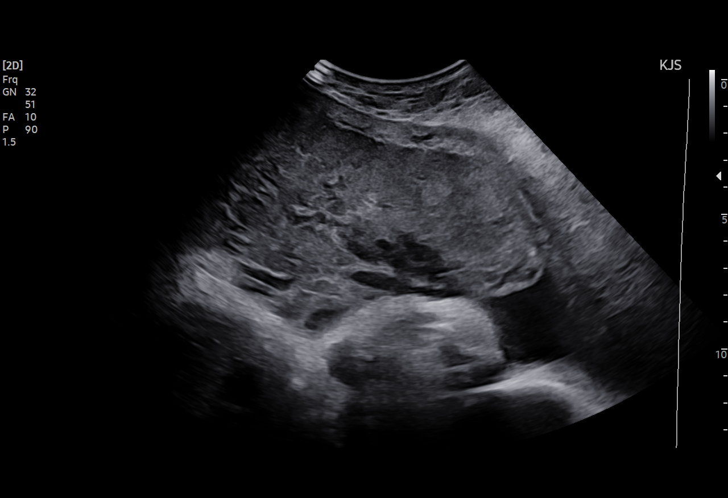
[im 24/29]
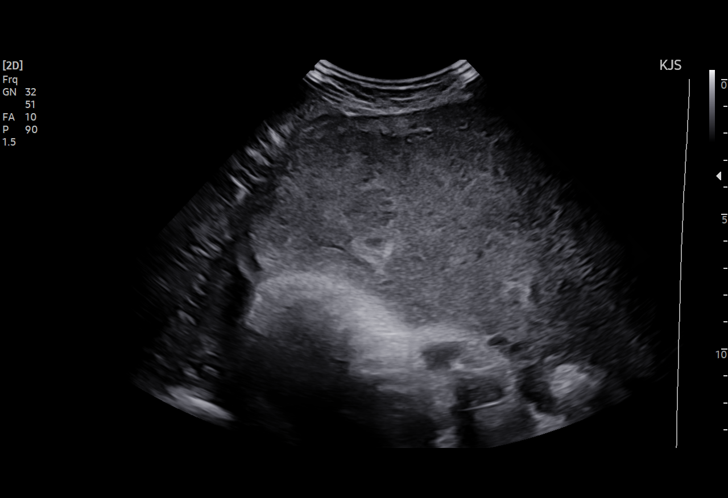
[im 26/29]
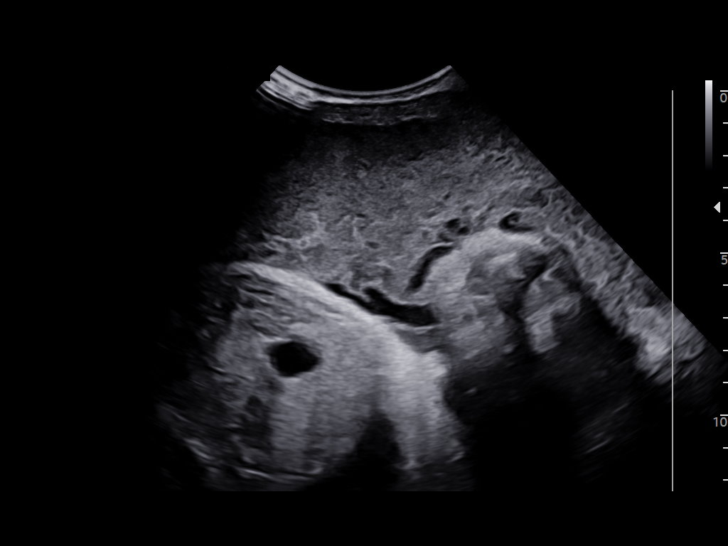
[im 29/29]
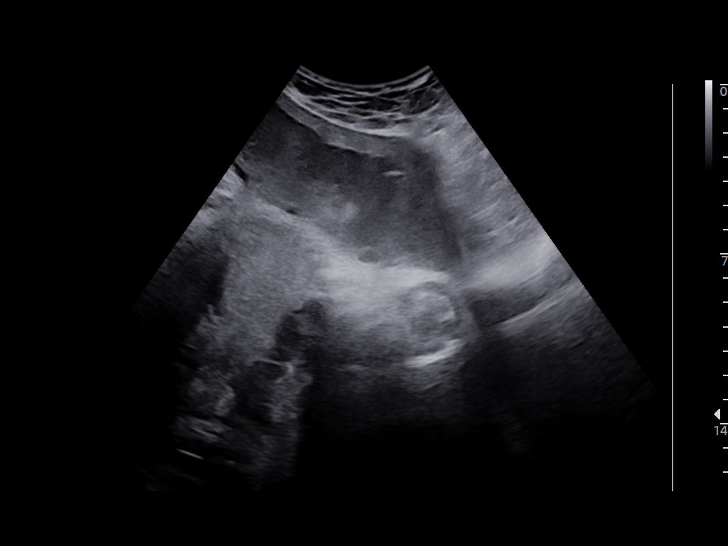

[14 of 28 positions shown; findings below may reference images not displayed]

CNM

Indications

 Obesity complicating pregnancy, third
 trimester
 36 weeks gestation of pregnancy
 Genetic carrier (silent Yadi Monique
 (aa/a-)
 LR NIPS/AFP-Neg
Vital Signs

 BP:          153/95
Fetal Evaluation

 Num Of Fetuses:         1
 Fetal Heart Rate(bpm):  137
 Cardiac Activity:       Observed
 Presentation:           Cephalic
 Placenta:               Anterior
 P. Cord Insertion:      Previously Visualized

 Amniotic Fluid
 AFI FV:      Within normal limits

 AFI Sum(cm)     %Tile       Largest Pocket(cm)
 14.51           53

 RUQ(cm)       RLQ(cm)       LUQ(cm)        LLQ(cm)

Biophysical Evaluation

 Amniotic F.V:   Pocket => 2 cm             F. Tone:        Observed
 F. Movement:    Observed                   Score:          [DATE]
 F. Breathing:   Observed
OB History

 Gravidity:    6         Term:   2        Prem:   0        SAB:   3
 TOP:          0       Ectopic:  0        Living: 2
Gestational Age

 LMP:           36w 3d        Date:  06/23/21                 EDD:   03/30/22
 Best:          36w 3d     Det. By:  LMP  (06/23/21)          EDD:   03/30/22
Impression

 MFM Brief Note

 Ms. Youngdae is a 36 yo G6P2 who is heere at 36 w 3d for
 antenatal testing at the request of Dr. Felner due to GHTN.

 She is overall doing well. She denies headache vision
 changes but reports new onset leg edema.

 Antenatal testing performed given maternal GHTN
 The biophysical profile was [DATE] with good fetal movement and
 amniotic fluid volume.

 Today we observed blood pressure 163/91 with repeat
 153/95 mmHg.

 In review of her labs she had a normal CMP and CBC  on
 [DATE] but an elevated UPC of 313. Thus she may have
 preeclampsia without severe features.

 I recommended she go to the [facility] for preeclampsia
 evaluation, including repeat labs, if her labs are abnormal or
 her blood pressure suggest severe features consider delivery
 otherwise consider delivery at 37 weeks as previously
 planned.

 I spent 30 minutes with > 50% in face to face consultation.

 I discussed this plan of care with Myangels Chai, CNM
